# Patient Record
Sex: Female | Born: 1940 | Race: White | Hispanic: No | Smoking: Current some day smoker
Health system: Southern US, Community
[De-identification: ages and names within clinical notes are randomized; demographics above are authoritative.]

## PROBLEM LIST (undated history)

## (undated) DIAGNOSIS — I10 Essential (primary) hypertension: Secondary | ICD-10-CM

## (undated) DIAGNOSIS — E7800 Pure hypercholesterolemia, unspecified: Secondary | ICD-10-CM

## (undated) DIAGNOSIS — F418 Other specified anxiety disorders: Secondary | ICD-10-CM

## (undated) DIAGNOSIS — E119 Type 2 diabetes mellitus without complications: Secondary | ICD-10-CM

## (undated) HISTORY — PX: FRACTURE SURGERY: SHX138

## (undated) HISTORY — PX: ABDOMINAL HYSTERECTOMY: SHX81

---

## 2004-09-15 ENCOUNTER — Emergency Department (HOSPITAL_COMMUNITY): Admission: EM | Admit: 2004-09-15 | Discharge: 2004-09-15 | Payer: Self-pay | Admitting: Emergency Medicine

## 2004-09-17 ENCOUNTER — Inpatient Hospital Stay (HOSPITAL_COMMUNITY): Admission: EM | Admit: 2004-09-17 | Discharge: 2004-09-21 | Payer: Self-pay | Admitting: Emergency Medicine

## 2005-07-29 ENCOUNTER — Ambulatory Visit: Payer: Self-pay | Admitting: Gastroenterology

## 2005-08-04 ENCOUNTER — Ambulatory Visit: Payer: Self-pay | Admitting: Gastroenterology

## 2005-08-04 ENCOUNTER — Ambulatory Visit (HOSPITAL_COMMUNITY): Admission: RE | Admit: 2005-08-04 | Discharge: 2005-08-04 | Payer: Self-pay | Admitting: Gastroenterology

## 2006-07-15 ENCOUNTER — Ambulatory Visit (HOSPITAL_COMMUNITY): Admission: RE | Admit: 2006-07-15 | Discharge: 2006-07-15 | Payer: Self-pay | Admitting: Family Medicine

## 2007-12-13 ENCOUNTER — Other Ambulatory Visit: Admission: RE | Admit: 2007-12-13 | Discharge: 2007-12-13 | Payer: Self-pay | Admitting: Obstetrics and Gynecology

## 2008-05-16 ENCOUNTER — Ambulatory Visit (HOSPITAL_COMMUNITY): Admission: RE | Admit: 2008-05-16 | Discharge: 2008-05-16 | Payer: Self-pay | Admitting: Family Medicine

## 2008-06-19 ENCOUNTER — Ambulatory Visit (HOSPITAL_COMMUNITY): Admission: RE | Admit: 2008-06-19 | Discharge: 2008-06-19 | Payer: Self-pay | Admitting: Family Medicine

## 2010-06-14 NOTE — Discharge Summary (Signed)
NAMEMERION, CATON              ACCOUNT NO.:  [id number]   MEDICAL RECORD NO.:  [id number]          PATIENT TYPE:  INP   LOCATION:  A223                          FACILITY:  [facility]   PHYSICIAN:  Angus G. Renard Matter, MD   DATE OF BIRTH:  1940-04-05   DATE OF ADMISSION:  09/17/2004  DATE OF DISCHARGE:  08/24/2006LH                                 DISCHARGE SUMMARY   DIAGNOSES:  1.  Urosepsis.  2.  Urinary tract infection.  3.  Bronchial pneumonia.  4.  Hypertension.  5.  Hyperlipidemia.  6.  Diabetes mellitus type 2.   CONDITION ON DISCHARGE:  Stable and improved.   HISTORY OF PRESENT ILLNESS:  This is a 70 year old white female who  presented to the ED with mental confusion, decreased mental status.  She was  evaluated by ED physician, and it was felt that she had bronchial pneumonia  and urosepsis.  Chest x-ray showed Cardiomegaly without failure.   PHYSICAL EXAMINATION:  GENERAL APPEARANCE:  Alert female.  VITAL SIGNS:  Blood pressure 110/59, respirations 24, pulse 112.  HEENT:  Eyes PERRLA.  TMs negative.  Oropharynx benign.  NECK:  Supple.  No JVD or thyroid abnormalities.  LUNGS:  Clear to P&A.  HEART:  Regular rhythm.  No murmurs.  ABDOMEN:  No palpable organs or masses.  SKIN:  Warm and dry.  EXTREMITIES:  Free of edema.   LABORATORY DATA:  Admission CBC:  WBC 16,200 with hemoglobin 11.8,  hematocrit 34.2.  Subsequent CBC on September 19, 2004:  WBC 9600, hemoglobin  10.5, hematocrit 31.4.  Chemistries showed sodium 130, potassium 3.5,  chloride 98, CO2 23, glucose 198, BUN 15, creatinine 1.2, calcium 7.8.  Subsequent chemistries on September 19, 2004:  Sodium 138, potassium 3.6,  chloride 106, CO2 25, glucose 158, BUN 8, creatinine 0.7, calcium 8.0.  Urinalysis increased number of WBC's.  Blood culture no growth in five days.  Urine cultures 75,000 colonies per cc.   X-RAYS:  Chest x-ray:  Cardiomegaly, interstitial and peribronchial  opacities.  CT of the head:  No  evidence of intracranial abnormality,  atrophy and chronic small vessel ischemic changes.  Left paranasal  sinusitis.   HOSPITAL COURSE:  The patient, at the time of her admission, was placed on  Rocephin 1 g q.24 h, Levaquin 500 mg IV q.24 h.  She was continued on Zoloft  50 mg daily.  She was placed on 1800 calorie ADA diet, IV fluids, normal  saline at 100 cc per hour.  The patient gradually improved through her  hospital stay.  Her IV Rocephin and  IV Levaquin was discontinued on September 20, 2004.  She was placed on p.o.  Levaquin 500 mg daily.  Was able to be discharged on hospital day #4.  Being  sent home on Levaquin 500 mg daily.  She did not need oxygen at the time of  her discharge.      Angus G. Renard Matter, MD  Electronically Signed     AGM/MEDQ  D:  10/15/2004  T:  10/15/2004  Job:  [id number]

## 2010-06-14 NOTE — Group Therapy Note (Signed)
NAMEKAZI, MONTORO              ACCOUNT NO.:  [id number]   MEDICAL RECORD NO.:  [id number]          PATIENT TYPE:  INP   LOCATION:  A223                          FACILITY:  [facility]   PHYSICIAN:  Angus G. Renard Matter, MD   DATE OF BIRTH:  [birth year]   DATE OF PROCEDURE:  09/18/2004  DATE OF DISCHARGE:                                   PROGRESS NOTE   SUBJECTIVE:  This patient was admitted through the ED with mental confusion  and diminished mental status.  She was felt to have bronchial pneumonia in  early stages and possible sepsis, secondary urinary tract infection.  The  patient still has an elevated white count of 16,200.  X-rays showed  increasing interstitial and peribronchial opacities, _urinary_________  infection.   OBJECTIVE:  VITAL SIGNS:  Blood pressure 147/74, respirations 28, pulse 109,  temperature 99.6.  Blood sugars range from 149-179.  LUNG:  Diminished breath sounds.  HEART:  Regular rate and rhythm.  NEUROLOGICAL:  The patient has generalized weakness.   IMPRESSION:  Possible sepsis.  Possible congestive heart failure.      Angus G. Renard Matter, MD  Electronically Signed     AGM/MEDQ  D:  09/18/2004  T:  09/18/2004  Job:  [id number]

## 2010-06-14 NOTE — Consult Note (Signed)
NAMEBRYNJA, MARKER              ACCOUNT NO.:  [id number]   MEDICAL RECORD NO.:  [id number]          PATIENT TYPE:  AMB   LOCATION:  DAY                           FACILITY:  [facility]   PHYSICIAN:  Kassie Mends, M.D.      DATE OF BIRTH:  11/18/1940   DATE OF CONSULTATION:  07/29/2005  DATE OF DISCHARGE:                                   CONSULTATION   HISTORY OF PRESENT ILLNESS:  Dear Dr. Hart Rochester,   I am seeing [name] as a new patient consultation per your request.  She  is being seen for heme-positive stools.   [name] is a 70 year old female with heme-positive stool in the office in  May 2007.  [name] reports seeing bright red blood in her stool  approximately one year ago.  She denies any melena.  She complains of  difficulty swallowing.  She might vomit once a week.  She denies any nausea.  She has heartburn and indigestion once a day.  She has two to three bowel  movements daily.  Her consistency is mushy to liquid to hard.  She does have  some lower abdominal pain which goes away with bowel movements.  Her history  is somewhat inconsistent.  Initially, she said that she had diarrhea once a  month, then it was watery stool one to two times a month, then it was bowel  movements two to three times a day, varying in consistency. She has not had  a colonoscopy.   PAST MEDICAL HISTORY:  Diabetes, hypertension, and depression. 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   PAST SURGICAL HISTORY:  Hysterectomy, hernia repair, appendectomy.   ALLERGIES:  PENICILLIN.   MEDICATIONS:  She cannot recall her medication list specifically.  She is  on diabetes medicine, hypertension medicine, cholesterol medicine, and a  blood pill.   FAMILY HISTORY:  No family history of colon cancer or colon polyps.  She has  a family history of diabetes.   SOCIAL HISTORY:  She is separated and has no children.  She is an only  child.  She smokes sometimes.   She denies any alcohol use.   REVIEW OF SYSTEMS:  Per HPI.  Otherwise, all systems are negative.   PHYSICAL EXAMINATION:  VITAL SIGNS:  Weight 213.5 pounds.  Height 5 feet and  5-1/2 inches.  BMI 40.2 (morbidly obese).  Temperature 97.6, blood pressure  120/82, pulse 100.  GENERAL:  She is in no apparent distress.  Alert and oriented x 4.  HEENT:  Atraumatic and normocephalic.  Pupils were equal and reactive to light.  Mouth:  No oral lesions.  Posterior pharynx without erythema or exudate.  She has poor dentition.NECK:  Full range of motion and no lymphadenopathy.  LUNGS:  Clear to auscultation bilaterally.  CARDIOVASCULAR:  Regular rhythm.  No murmur.  Normal S1 and S2.  ABDOMEN:  Bowel sounds present, soft,  nontender, nondistended.  No rebound or guarding.  Obese.  No  hepatosplenomegaly appreciated.  EXTREMITIES:  Without clubbing, cyanosis or  edema.  NEUROLOGIC:  She has no focal deficits.   LABORATORY DATA:  August 2006: creatinine 0.7, hemoglobin 10.5 with normal  MCV, platelets 267, albumin 3.4.  Otherwise, her hepatic function panel was  normal.   IMPRESSION:  [name] is a 70 year old female with Hemoccult-positive  stool and a normocytic anemia in August 2006.  She has no overt GI bleeding.  She is average risk for colon cancer occurrence.  Thank you for allowing me  to see [name] in consultation.  My list of recommendations follow.   RECOMMENDATIONS:  I recommend colonoscopy to evaluate her heme-positive  stools.  She may follow up with me as needed.      Kassie Mends, M.D.  Electronically Signed     SM/MEDQ  D:  07/29/2005  T:  07/29/2005  Job:  [id number]   cc:   Zerita Boers, N.M.  Fax: [phone]

## 2010-06-14 NOTE — Group Therapy Note (Signed)
NAMESKILER, TYE              ACCOUNT NO.:  [id number]   MEDICAL RECORD NO.:  [id number]          PATIENT TYPE:  INP   LOCATION:  A223                          FACILITY:  [facility]   PHYSICIAN:  Angus G. Renard Matter, MD   DATE OF BIRTH:  10/03/1940   DATE OF PROCEDURE:  DATE OF DISCHARGE:                                   PROGRESS NOTE   This patient was admitted through the ED with mental confusion, diminished  mental status.  She was felt to have bronchopneumonia in early stages,  possible sepsis secondary to urinary tract infection.  The patient's white  blood count is now 9.6, with hemoglobin 10.5, hematocrit 31.4.   Blood pressure 160/83; respirations 20; pulse 83; temperature 98.3.  HEART:  Regular rhythm.  LUNGS:  Rhonchi over lower lung field.  ABDOMEN:  No palpable organs or masses.   ASSESSMENT:  The patient was admitted with mental confusion, respiratory  distress, possible sepsis.   PLAN:  To continue current regimen.      Angus G. Renard Matter, MD  Electronically Signed     AGM/MEDQ  D:  09/19/2004  T:  09/19/2004  Job:  [id number]

## 2010-06-14 NOTE — Group Therapy Note (Signed)
[name], [name]              ACCOUNT NO.:  [id number]   MEDICAL RECORD NO.:  [id number]          PATIENT TYPE:  INP   LOCATION:  A223                          FACILITY:  [facility]   PHYSICIAN:  Angus G. Renard Matter, MD   DATE OF BIRTH:  11/29/1940   DATE OF PROCEDURE:  09/20/2004  DATE OF DISCHARGE:                                   PROGRESS NOTE   SUBJECTIVE:  This patient was admitted with mental confusion, decreased  mental status.  Was felt to have bronchial pneumonia in early stages,  possible sepsis.  The patient's condition remained stable.   OBJECTIVE:  VITAL SIGNS:  Blood pressure 161/98, respirations 24, pulse  98.2.  LUNGS:  Clear.  HEART:  Regular rhythm.  ABDOMEN:  No palpable organs or masses.   ASSESSMENT:  The patient was admitted with change in mental status.  He has  also upper respiratory infection with cough, possible early pneumonia.  Condition remained stable.   PLAN:  Continue current regimen.  Will attempt to get patient discharged  shortly.      Angus G. Renard Matter, MD  Electronically Signed     AGM/MEDQ  D:  09/20/2004  T:  09/20/2004  Job:  [id number]

## 2010-06-14 NOTE — Op Note (Signed)
NAMEDELLAR, TRABER              ACCOUNT NO.:  [id number]   MEDICAL RECORD NO.:  [id number]          PATIENT TYPE:  AMB   LOCATION:  DAY                           FACILITY:  [facility]   PHYSICIAN:  Kassie Mends, M.D.      DATE OF BIRTH:  13-Feb-1940   DATE OF PROCEDURE:  08/04/2005  DATE OF DISCHARGE:                                 OPERATIVE REPORT   REFERRING PHYSICIAN:  Zerita Boers, N.M.   PROCEDURE:  Colonoscopy.   MEDICATIONS:  1.  Demerol 75 mg IV.  2.  Versed 6 mg IV.   INDICATIONS FOR PROCEDURE:  [name] is a 70 year old patient with heme  positive stool.  She presents as well for average risk colon cancer  screening.   FINDINGS:  1.  Left-sided diverticulosis.  Otherwise she had no evidence of polyps,      masses, inflammatory changes, or vascular ectasias.  2.  Large internal hemorrhoids, likely source of heme positive stool.   RECOMMENDATIONS:  1.  High fiber diet.  Consider stool softener one up to three times daily.  2.  Repeat screening colonoscopy in 10 years.  3.  Follow up with Angus G. McInnis, M.D.   DESCRIPTION OF PROCEDURE:  A physical examination was performed and informed  consent was obtained from the patient after explaining all risks  (perforation, bleeding, infection, adverse effects to medicines), benefits,  and alternatives to the procedure which the patient appeared to understand  and so stated.  The patient was connected to the monitoring device and  placed in the left lateral position.  Continuous oxygen was provided by  nasal cannula and IV medicine was administered through an indwelling  cannula.  After administration of sedation and rectal examination, the  patient's rectum was intubated and the scope was advanced under direct  visualization to the cecum.  The scope was subsequently removed slowly by carefully examining the color,  texture, anatomy, and integrity of mucosa on the way out.  The patient was  recovered in the endoscopy  suite and discharged postoperatively in  satisfactory condition.      Kassie Mends, M.D.  Electronically Signed     SM/MEDQ  D:  08/04/2005  T:  08/04/2005  Job:  [id number]   cc:   Angus G. Renard Matter, MD  Fax: [phone]

## 2010-06-14 NOTE — H&P (Signed)
[name], [name]              ACCOUNT NO.:  [id number]   MEDICAL RECORD NO.:  [id number]          PATIENT TYPE:  INP   LOCATION:  A223                          FACILITY:  [facility]   PHYSICIAN:  Angus G. Renard Matter, MD   DATE OF BIRTH:  [birth year]   DATE OF ADMISSION:  09/16/2004  DATE OF DISCHARGE:  LH                                HISTORY & PHYSICAL   This 70 year old black female presented to the ED with mental confusion,  decreased mental status. She was evaluated by the ED physician and was felt  to have bronchial pneumonia and urosepsis. Chest x-ray showed cardiomegaly  without failure.   LABORATORY DATA:  CBC with a WBC of 15,400, hemoglobin 12.3, hematocrit  35.0. Chemistries - sodium 134, potassium 4, chloride 101, CO2 25.  Urinalysis with a small number of leukocytes, many bacteria. The patient was  subsequent admitted.   SOCIAL HISTORY:  The patient does not smoke or use alcohol.   FAMILY HISTORY:  Positive for coronary artery disease, hypertension.   PAST MEDICAL HISTORY:  1.  Positive for hypertension.  2.  Hyperlipidemia.  3.  Non-insulin-dependent diabetes.   ALLERGIES:  PENICILLIN.   MEDICATIONS:  1.  Zoloft 50 mg daily.  2.  Glucovance 5/500 b.i.d.  3.  Hydrochlorothiazide 25 mg daily.  4.  Zocor 40 mg daily.  5.  Altace 10 mg daily.   REVIEW OF SYSTEMS:  HEENT:  Negative. CARDIOPULMONARY:  Negative for cough.  GI:  No nausea and vomiting, diarrhea. GU:  No dysuria or hematuria.   PHYSICAL EXAMINATION:  GENERAL:  This is an alert female.  VITAL SIGNS:  Blood pressure 110/59, respirations 24, pulse 112, O2  saturation 98%.  HEENT:  Eyes - PERRLA. TMs negative. Oropharynx negative.  NECK:  Supple, no JVD or thyroid abnormalities.  LUNGS:  Clear to P&A.  HEART:  Regular rhythm, no murmurs.  ABDOMEN:  No palpable organs or masses.  SKIN:  Warm and dry.  EXTREMITIES:  Free of edema.  NEUROLOGIC:  No focal deficit.   DIAGNOSES:  1.  Urosepsis.  2.   Probable pneumonia.      Angus G. Renard Matter, MD  Electronically Signed     AGM/MEDQ  D:  09/17/2004  T:  09/17/2004  Job:  [id number]

## 2010-07-31 ENCOUNTER — Inpatient Hospital Stay (HOSPITAL_COMMUNITY)
Admission: EM | Admit: 2010-07-31 | Discharge: 2010-08-02 | DRG: 195 | Disposition: A | Discharge status: Home Health | Payer: Medicare Other | Attending: Family Medicine | Admitting: Family Medicine

## 2010-07-31 ENCOUNTER — Emergency Department (HOSPITAL_COMMUNITY): Payer: Medicare Other

## 2010-07-31 DIAGNOSIS — E1169 Type 2 diabetes mellitus with other specified complication: Secondary | ICD-10-CM | POA: Diagnosis present

## 2010-07-31 DIAGNOSIS — E860 Dehydration: Secondary | ICD-10-CM | POA: Diagnosis present

## 2010-07-31 DIAGNOSIS — Z794 Long term (current) use of insulin: Secondary | ICD-10-CM

## 2010-07-31 DIAGNOSIS — J180 Bronchopneumonia, unspecified organism: Principal | ICD-10-CM | POA: Diagnosis present

## 2010-07-31 LAB — COMPREHENSIVE METABOLIC PANEL
ALT: 12 U/L (ref 0–35)
AST: 16 U/L (ref 0–37)
Albumin: 3.5 g/dL (ref 3.5–5.2)
CO2: 28 mEq/L (ref 19–32)
Calcium: 9.6 mg/dL (ref 8.4–10.5)
GFR calc Af Amer: 57 mL/min — ABNORMAL LOW (ref >60)
Glucose, Bld: 82 mg/dL (ref 70–99)
Potassium: 4.1 mEq/L (ref 3.5–5.1)
Sodium: 140 mEq/L (ref 135–145)
Total Bilirubin: 0.2 mg/dL — ABNORMAL LOW (ref 0.3–1.2)

## 2010-07-31 LAB — DIFFERENTIAL
Basophils Absolute: 0 10*3/uL (ref 0.0–0.1)
Eosinophils Absolute: 0.2 10*3/uL (ref 0.0–0.7)
Lymphocytes Relative: 17 % (ref 12–46)
Monocytes Absolute: 0.6 10*3/uL (ref 0.1–1.0)
Monocytes Relative: 6 % (ref 3–12)
Neutrophils Relative %: 75 % (ref 43–77)

## 2010-07-31 LAB — GLUCOSE, CAPILLARY
Glucose-Capillary: 168 mg/dL — ABNORMAL HIGH (ref 70–99)
Glucose-Capillary: 55 mg/dL — ABNORMAL LOW (ref 70–99)

## 2010-07-31 LAB — URINALYSIS, ROUTINE W REFLEX MICROSCOPIC
Glucose, UA: 500 mg/dL — AB
Leukocytes, UA: NEGATIVE
Nitrite: NEGATIVE
Protein, ur: NEGATIVE mg/dL
Urobilinogen, UA: 0.2 mg/dL (ref 0.0–1.0)

## 2010-07-31 LAB — CK TOTAL AND CKMB (NOT AT ARMC)
Relative Index: INVALID (ref 0.0–2.5)
Total CK: 59 U/L (ref 7–177)

## 2010-08-01 LAB — DIFFERENTIAL
Basophils Relative: 0 % (ref 0–1)
Eosinophils Relative: 2 % (ref 0–5)
Lymphs Abs: 2.5 10*3/uL (ref 0.7–4.0)
Monocytes Relative: 7 % (ref 3–12)
Neutro Abs: 4.8 10*3/uL (ref 1.7–7.7)
Neutrophils Relative %: 60 % (ref 43–77)

## 2010-08-01 LAB — GLUCOSE, CAPILLARY
Glucose-Capillary: 207 mg/dL — ABNORMAL HIGH (ref 70–99)
Glucose-Capillary: 211 mg/dL — ABNORMAL HIGH (ref 70–99)
Glucose-Capillary: 220 mg/dL — ABNORMAL HIGH (ref 70–99)
Glucose-Capillary: 259 mg/dL — ABNORMAL HIGH (ref 70–99)

## 2010-08-01 LAB — BASIC METABOLIC PANEL
CO2: 26 mEq/L (ref 19–32)
Potassium: 4.2 mEq/L (ref 3.5–5.1)
Sodium: 138 mEq/L (ref 135–145)

## 2010-08-01 LAB — CBC
HCT: 34.3 % — ABNORMAL LOW (ref 36.0–46.0)
MCH: 30.1 pg (ref 26.0–34.0)
MCV: 89.1 fL (ref 78.0–100.0)

## 2010-08-02 ENCOUNTER — Inpatient Hospital Stay (HOSPITAL_COMMUNITY): Payer: Medicare Other

## 2010-08-02 LAB — URINE CULTURE
Colony Count: NO GROWTH
Culture: NO GROWTH

## 2010-08-02 LAB — GLUCOSE, CAPILLARY
Glucose-Capillary: 195 mg/dL — ABNORMAL HIGH (ref 70–99)
Glucose-Capillary: 232 mg/dL — ABNORMAL HIGH (ref 70–99)

## 2010-08-02 MED ORDER — ENOXAPARIN SODIUM 40 MG/0.4ML ~~LOC~~ SOLN: 40.0000 mg | SUBCUTANEOUS | Status: DC

## 2010-08-02 MED ORDER — BIOTENE DRY MOUTH MT LIQD
Freq: Two times a day (BID) | OROMUCOSAL | Status: DC
  Filled 2010-08-02 (×5): qty 15

## 2010-08-02 MED ORDER — DEXTROSE 5 % IV SOLN: 1.0000 g | INTRAVENOUS | Status: DC

## 2010-08-02 MED ORDER — LINAGLIPTIN 5 MG PO TABS
5.0000 mg | ORAL_TABLET | Freq: Every day | ORAL | Status: DC
  Filled 2010-08-02 (×3): qty 1

## 2010-08-02 MED ORDER — INSULIN ASPART 100 UNIT/ML ~~LOC~~ SOLN: 0.0000 [IU] | Freq: Three times a day (TID) | SUBCUTANEOUS | Status: DC

## 2010-08-02 MED ORDER — DEXTROSE-NACL 5-0.9 % IV SOLN: INTRAVENOUS | Status: DC

## 2010-08-02 MED ORDER — INSULIN ASPART 100 UNIT/ML ~~LOC~~ SOLN: 0.0000 [IU] | Freq: Every day | SUBCUTANEOUS | Status: DC

## 2010-08-02 MED ORDER — AZITHROMYCIN 250 MG PO TABS: 500.0000 mg | ORAL_TABLET | Freq: Every day | ORAL | Status: DC

## 2010-08-03 NOTE — Discharge Summary (Signed)
[name], [name]              ACCOUNT NO.:  [id number]  MEDICAL RECORD NO.:  [id number]  LOCATION:  A316                          FACILITY:  [facility]  PHYSICIAN:  Jerzy Roepke G. Renard Matter, MD   DATE OF BIRTH:  09/02/1940  DATE OF ADMISSION:  07/31/2010 DATE OF DISCHARGE:  07/06/2012LH                              DISCHARGE SUMMARY   DIAGNOSES: 1. Non-insulin dependent diabetes. 2. Hypoglycemia. 3. Bronchopneumonia.  The patient's condition stable at that the time of her discharge.  This 70 year old white female became unresponsive at home.  EMS was called, was found that she had extremely low blood sugar of 20.  She was given an amp of D50, repeat blood sugar was 126, and she did not do another amp of D50 and sugar started dropping, and she was brought to emergency room, and seen by emergency room physician.  She was started on IV glucose.  X-rays of her chest showed patchy perihilar upper lobe infiltrate thought to be atelectasis or infiltrates.  The patient was placed on IV Rocephin 1 g daily and Zithromax 500 mg every 24 hours, and subsequently was admitted.  PHYSICAL EXAMINATION:  GENERAL:  On admission, alert and oriented. VITAL SIGNS:  The patient with blood pressure 149/84, respirations 20, pulse 97.4. HEENT:  Eyes PERRLA.  TMs negative.  Oropharynx benign. NECK:  Supple.  No JVD, no thyroid abnormalities. LUNGS:  Diminished breath sounds. HEART:  Regular rhythm.  No murmurs. ABDOMEN:  No palpable organs or masses.  No organomegaly. EXTREMITIES:  Free of edema. NEUROLOGIC:  No focal deficits.  LABORATORY DATA:  Admission CBC, WBC 9300 with hemoglobin 12.4, hematocrit 37.0.  CMP, sodium 140, potassium 4.1, chloride 102, CO2 of 28, glucose 82, creatinine 1.15, CPK-MB 2.6, CPK 59, lipase 106. Hemoglobin A1c was 8.6.  BMET on August 01, 2010, showed sodium 138, potassium 4.2, chloride 101, CO2 of 26, glucose 214, BUN 19, creatinine 1.04.  X-rays, chest x-ray done on July 31, 2010,  showed evidence of cardiomegaly, low lung volumes with patchy perihilar and upper lobe infiltrate.  Subsequent chest x-ray on August 02, 2010, improve in aeration bilaterally, suspect resolving asymmetric edema and/or atelectasis.  HOSPITAL COURSE:  The patient at the time of her admission was placed on IV D5 normal saline at 100 mL an hour, 1800-calorie ADA diet, nasal O2 at 2 liters per minute.  She was placed on NovoLog sliding scale insulin and subcutaneous Lovenox daily for DVT prophylaxis.  Also was placed on IV Rocephin 1 g every 24 hours and Zithromax 500 mg daily.  The patient's blood sugars responded to IV glucose.  Subsequently, her glipizide was discontinued and she was placed on Tradjenta 5 mg daily and continued on the sliding scale.  The patient became more alert.  Her sugars ranged 195-240.  It was felt that she was stable enough after 2 days' hospitalization, be discharged home.  Arrangements were made through Social Services for her to have help at home.  She was discharged on following medications: 1. Tradjenta 5 mg daily. 2. HCTZ 25 mg daily. 3. Lorazepam 0.5 mg every 4 hours as needed. 4. Nortriptyline 25 mg b.i.d. 5. Simvastatin 40 mg daily. 6. Vicodin  ES 7.5/750 every 4 hours as needed. 7. Vitamin D3 1000 units daily. 8. Metformin 500 mg b.i.d. 9. Glyburide 5 mg b.i.d.     [name] G. Renard Matter, MD     AGM/MEDQ  D:  08/02/2010  T:  08/03/2010  Job:  [id number]

## 2010-08-20 NOTE — Progress Notes (Unsigned)
[name], [name]              ACCOUNT NO.:  [id number]  MEDICAL RECORD NO.:  [id number]  LOCATION:                                 FACILITY:  PHYSICIAN:  Zakery Normington G. Renard Matter, MD   DATE OF BIRTH:  1940/02/14  DATE OF PROCEDURE: DATE OF DISCHARGE:                                PROGRESS NOTE   This patient was admitted to the hospital after having experienced episode of hypoglycemia.  She also noted to have perihilar infiltrate and pneumonia, was started on IV Rocephin and Zithromax.  Her blood sugars currently running from 211-259.  OBJECTIVE:  VITAL SIGNS:  Blood pressure 184/117, pulse 106, respirations 16, temperature 97.6.  Her hemoglobin A1c was 8.6. LUNGS:  Diminished breath sounds bilaterally. HEART:  Regular rhythm. ABDOMEN:  No palpable organs or masses.  ASSESSMENT:  The patient was admitted with hypoglycemia.  He does have diabetes and remained on oral medications.  Does have perihilar infiltrate on x-ray.  The patient is feeling some better.  Sugar is more stable.  Apparently per the nursing staff, the patient does have issues at home indicative of the fact that she is not taking her medications appropriately.  PLAN:  To obtain social service consult concerning these problems. Repeat chest x-ray.     Montel Vanderhoof G. Renard Matter, MD     AGM/MEDQ  D:  08/02/2010  T:  08/02/2010  Job:  [id number]

## 2010-08-23 NOTE — Progress Notes (Signed)
  [name], [name]              ACCOUNT NO.:  [id number]  MEDICAL RECORD NO.:  [id number]  LOCATION:  A316                          FACILITY:  [facility]  PHYSICIAN:  Mayzee Reichenbach G. Renard Matter, MD   DATE OF BIRTH:  1941-01-19  DATE OF PROCEDURE: DATE OF DISCHARGE:                                PROGRESS NOTE   This patient was admitted to the hospital after having experienced episode of hypoglycemia.  She was given D50 on 2 occasions.  Her blood sugars stabilized.  She was also noted to have evidence of pneumonia, perihilar infiltrate and cardiomegaly by x-ray, was started on IV Rocephin and Zithromax.  OBJECTIVE:  VITAL SIGNS:  Blood pressure 149/84, respirations 20, pulse 97, temperature 97.4. LUNGS:  Diminished breath sounds bilaterally. HEART:  Regular rhythm. ABDOMEN:  No palpable organs or masses.  ASSESSMENT:  The patient admitted with hypoglycemia.  She does have non- insulin-dependent diabetes.  She is noted to have evidence of perihilar infiltrate compatible with pneumonia.  PLAN:  To continue to monitor blood sugars.  Continue IV antibiotic.     Devonia Farro G. Renard Matter, MD     AGM/MEDQ  D:  08/01/2010  T:  08/01/2010  Job:  [id number]  Electronically Signed by Butch Penny MD on 08/23/2010 01:55:13 PM

## 2010-08-26 NOTE — H&P (Signed)
  NAMEEILEY, MCGINNITY              ACCOUNT NO.:  [id number]  MEDICAL RECORD NO.:  [id number]  LOCATION:  A316                          FACILITY:  [facility]  PHYSICIAN:  Marianela Mandrell G. Renard Matter, MD   DATE OF BIRTH:  [birth year]  DATE OF ADMISSION:  07/31/2010 DATE OF DISCHARGE:  LH                             HISTORY & PHYSICAL   This 70 year old white female became unresponsive at home.  EMS was called and found that she had an extremely low blood sugar of 20.  She was given an amp of D50, repeat blood sugar was 126 and then she did need another amp of D50 such that blood sugar drop back again to 55. She was brought into the emergency room and seen by emergency room physician.  X-rays of chest showed patchy perihilar upper lobe opacities, thought to be atelectasis or infiltrates.  The patient was subsequently admitted.  Placed on IV Rocephin 1 gram and Zithromax 500 mg every 24 hours.  SURGICAL HISTORY:  Unknown.  SOCIAL HISTORY:  The patient lives alone.  The patient has past history of diabetes.  MEDICATION LIST: 1. Metformin 500 mg. 2. Glyburide 5 mg daily. 3. Nortriptyline 25 mg. 4. Lorazepam 0.5 mg. 5. Hydrochlorothiazide 25 mg. 6. Simvastatin 40 mg.  PHYSICAL EXAMINATION:  VITAL SIGNS:  The patient is alert and oriented with blood pressure 149/84, respirations 20, pulse 97.4. HEENT:  Eyes:  PERRLA.  TMs negative.  Oropharynx benign. NECK:  Supple.  No JVD or thyroid abnormalities. LUNGS:  Diminished breath sounds. HEART:  Regular rhythm.  No murmurs. Abdomen:  No palpable organs or masses.  No organomegaly. EXTREMITIES:  Free of edema. NEUROLOGIC:  No focal deficit.  ASSESSMENT:  The patient does have known diabetes, was admitted with hypoglycemia and found to have evidence of pneumonia, patchy perihilar and upper lobe atelectasis or infiltrates.  PLAN:  To continue IV antibiotics, continue to monitor blood sugars. The patient currently is on sliding scale NovoLog  insulin.     Kaulin Chaves G. Renard Matter, MD     AGM/MEDQ  D:  08/01/2010  T:  08/01/2010  Job:  [id number]  Electronically Signed by Butch Penny MD on 08/26/2010 06:52:30 AM

## 2012-06-21 ENCOUNTER — Encounter (HOSPITAL_COMMUNITY): Payer: Self-pay | Admitting: *Deleted

## 2012-06-21 ENCOUNTER — Emergency Department (HOSPITAL_COMMUNITY): Payer: Medicare Other

## 2012-06-21 ENCOUNTER — Emergency Department (HOSPITAL_COMMUNITY)
Admission: EM | Admit: 2012-06-21 | Discharge: 2012-06-21 | Disposition: A | Discharge status: Home / Self Care | Payer: Medicare Other | Attending: Emergency Medicine | Admitting: Emergency Medicine

## 2012-06-21 DIAGNOSIS — E7800 Pure hypercholesterolemia, unspecified: Secondary | ICD-10-CM | POA: Insufficient documentation

## 2012-06-21 DIAGNOSIS — Z79899 Other long term (current) drug therapy: Secondary | ICD-10-CM | POA: Insufficient documentation

## 2012-06-21 DIAGNOSIS — IMO0002 Reserved for concepts with insufficient information to code with codable children: Secondary | ICD-10-CM | POA: Insufficient documentation

## 2012-06-21 DIAGNOSIS — Y9389 Activity, other specified: Secondary | ICD-10-CM | POA: Insufficient documentation

## 2012-06-21 DIAGNOSIS — F17200 Nicotine dependence, unspecified, uncomplicated: Secondary | ICD-10-CM | POA: Insufficient documentation

## 2012-06-21 DIAGNOSIS — S20219A Contusion of unspecified front wall of thorax, initial encounter: Secondary | ICD-10-CM | POA: Insufficient documentation

## 2012-06-21 DIAGNOSIS — N390 Urinary tract infection, site not specified: Secondary | ICD-10-CM | POA: Insufficient documentation

## 2012-06-21 DIAGNOSIS — R509 Fever, unspecified: Secondary | ICD-10-CM | POA: Insufficient documentation

## 2012-06-21 DIAGNOSIS — Z880 Allergy status to penicillin: Secondary | ICD-10-CM | POA: Insufficient documentation

## 2012-06-21 DIAGNOSIS — W010XXA Fall on same level from slipping, tripping and stumbling without subsequent striking against object, initial encounter: Secondary | ICD-10-CM | POA: Insufficient documentation

## 2012-06-21 DIAGNOSIS — Z8781 Personal history of (healed) traumatic fracture: Secondary | ICD-10-CM | POA: Insufficient documentation

## 2012-06-21 DIAGNOSIS — S20212A Contusion of left front wall of thorax, initial encounter: Secondary | ICD-10-CM

## 2012-06-21 DIAGNOSIS — E119 Type 2 diabetes mellitus without complications: Secondary | ICD-10-CM | POA: Insufficient documentation

## 2012-06-21 DIAGNOSIS — S3981XA Other specified injuries of abdomen, initial encounter: Secondary | ICD-10-CM | POA: Insufficient documentation

## 2012-06-21 DIAGNOSIS — Z90710 Acquired absence of both cervix and uterus: Secondary | ICD-10-CM | POA: Insufficient documentation

## 2012-06-21 DIAGNOSIS — Y9289 Other specified places as the place of occurrence of the external cause: Secondary | ICD-10-CM | POA: Insufficient documentation

## 2012-06-21 DIAGNOSIS — R0602 Shortness of breath: Secondary | ICD-10-CM | POA: Insufficient documentation

## 2012-06-21 DIAGNOSIS — I10 Essential (primary) hypertension: Secondary | ICD-10-CM | POA: Insufficient documentation

## 2012-06-21 HISTORY — DX: Pure hypercholesterolemia, unspecified: E78.00

## 2012-06-21 HISTORY — DX: Type 2 diabetes mellitus without complications: E11.9

## 2012-06-21 HISTORY — DX: Essential (primary) hypertension: I10

## 2012-06-21 LAB — POCT I-STAT, CHEM 8
Calcium, Ion: 1.18 mmol/L (ref 1.13–1.30)
Creatinine, Ser: 1 mg/dL (ref 0.50–1.10)
Glucose, Bld: 109 mg/dL — ABNORMAL HIGH (ref 70–99)
Hemoglobin: 12.9 g/dL (ref 12.0–15.0)
Potassium: 4.2 mEq/L (ref 3.5–5.1)

## 2012-06-21 LAB — URINALYSIS, ROUTINE W REFLEX MICROSCOPIC
Bilirubin Urine: NEGATIVE
Hgb urine dipstick: NEGATIVE
Ketones, ur: NEGATIVE mg/dL
Nitrite: POSITIVE — AB
Specific Gravity, Urine: 1.02 (ref 1.005–1.030)
Urobilinogen, UA: 0.2 mg/dL (ref 0.0–1.0)

## 2012-06-21 LAB — COMPREHENSIVE METABOLIC PANEL
ALT: 11 U/L (ref 0–35)
AST: 19 U/L (ref 0–37)
Alkaline Phosphatase: 71 U/L (ref 39–117)
CO2: 31 mEq/L (ref 19–32)
Chloride: 99 mEq/L (ref 96–112)
GFR calc Af Amer: 61 mL/min — ABNORMAL LOW (ref >90)
GFR calc non Af Amer: 52 mL/min — ABNORMAL LOW (ref >90)
Glucose, Bld: 208 mg/dL — ABNORMAL HIGH (ref 70–99)
Potassium: 4.6 mEq/L (ref 3.5–5.1)
Sodium: 138 mEq/L (ref 135–145)
Total Bilirubin: 0.2 mg/dL — ABNORMAL LOW (ref 0.3–1.2)

## 2012-06-21 LAB — CBC WITH DIFFERENTIAL/PLATELET
Basophils Absolute: 0 10*3/uL (ref 0.0–0.1)
Eosinophils Relative: 2 % (ref 0–5)
Lymphocytes Relative: 30 % (ref 12–46)
Lymphs Abs: 2.6 10*3/uL (ref 0.7–4.0)
MCV: 88.7 fL (ref 78.0–100.0)
Neutro Abs: 5.4 10*3/uL (ref 1.7–7.7)
Platelets: 260 10*3/uL (ref 150–400)
RBC: 4.26 MIL/uL (ref 3.87–5.11)
RDW: 12.7 % (ref 11.5–15.5)
WBC: 8.7 10*3/uL (ref 4.0–10.5)

## 2012-06-21 LAB — URINE MICROSCOPIC-ADD ON

## 2012-06-21 LAB — GLUCOSE, CAPILLARY

## 2012-06-21 MED ORDER — CIPROFLOXACIN HCL 250 MG PO TABS
500.0000 mg | ORAL_TABLET | Freq: Once | ORAL | Status: AC
  Administered 2012-06-21: 500 mg via ORAL
  Filled 2012-06-21: qty 2

## 2012-06-21 MED ORDER — CIPROFLOXACIN HCL 500 MG PO TABS: 500.0000 mg | ORAL_TABLET | Freq: Two times a day (BID) | ORAL | Status: DC

## 2012-06-21 MED ORDER — IOHEXOL 300 MG/ML  SOLN
100.0000 mL | Freq: Once | INTRAMUSCULAR | Status: AC | PRN
  Administered 2012-06-21: 100 mL via INTRAVENOUS
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
## 2012-06-21 NOTE — ED Notes (Signed)
Red socks and yellow bracelet placed.  Family members remain at bedside.
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
## 2012-06-21 NOTE — ED Provider Notes (Signed)
History    This chart was scribed for Benny Lennert, MD by Leone Payor, ED Scribe. This patient was seen in room APA10/APA10 and the patient's care was started 11:24 AM.   CSN: [id number]  Arrival date & time 06/21/12  0940   First MD Initiated Contact with Patient 06/21/12 1122      Chief Complaint  Patient presents with  . Back Pain  . Fall     Patient is a 72 y.o. female presenting with fall. The history is provided by the patient. No language interpreter was used.  Fall This is a new problem. The current episode started 2 days ago. The problem occurs constantly. The problem has not changed since onset.Associated symptoms include chest pain, abdominal pain and shortness of breath. Pertinent negatives include no headaches.    HPI Comments: [name] is a 72 y.o. female who presents to the Emergency Department complaining of a fall that occurred 2 days ago. Pt states she slipped and fell on coccyx while getting out of a car. Now she complains of pain to L lateral chest and abdomen. She has an associated fever. She denies hitting her head or having LOC. She denies cough, vomiting, back pain. Pt has h/o DM, HTN, hypercholesterolemia.     Past Medical History  Diagnosis Date  . Diabetes mellitus without complication   . Hypertension   . Hypercholesterolemia     Past Surgical History  Procedure Laterality Date  . Abdominal hysterectomy    . Fracture surgery      History reviewed. No pertinent family history.  History  Substance Use Topics  . Smoking status: Current Some Day Smoker    Types: Cigarettes  . Smokeless tobacco: Not on file  . Alcohol Use: No    OB History   Grav Para Term Preterm Abortions TAB SAB Ect Mult Living   0               Review of Systems  Constitutional: Positive for fever. Negative for appetite change and fatigue.  HENT: Negative for congestion, neck pain, sinus pressure and ear discharge.   Eyes: Negative for discharge.   Respiratory: Positive for shortness of breath. Negative for cough.   Cardiovascular: Positive for chest pain.  Gastrointestinal: Positive for abdominal pain. Negative for diarrhea.  Genitourinary: Negative for frequency and hematuria.  Musculoskeletal: Positive for arthralgias (rib pain). Negative for back pain.  Skin: Negative for rash.  Neurological: Negative for seizures and headaches.  Psychiatric/Behavioral: Negative for hallucinations.    Allergies  Penicillins  Home Medications   Current Outpatient Rx  Name  Route  Sig  Dispense  Refill  . calcium carbonate (OS-CAL) 600 MG TABS   Oral   Take 600 mg by mouth 2 (two) times daily with a meal.         . glipiZIDE-metformin (METAGLIP) 5-500 MG per tablet   Oral   Take 1 tablet by mouth 2 (two) times daily before a meal.         . hydrochlorothiazide (HYDRODIURIL) 25 MG tablet   Oral   Take 25 mg by mouth daily as needed (blood pressure).         Marland Kitchen HYDROcodone-acetaminophen (VICODIN) 5-500 MG per tablet   Oral   Take 1 tablet by mouth every 4 (four) hours as needed for pain.         Marland Kitchen linagliptin (TRADJENTA) 5 MG TABS tablet   Oral   Take 5 mg by mouth daily.         Marland Kitchen  LORazepam (ATIVAN) 0.5 MG tablet   Oral   Take 0.5 mg by mouth every 4 (four) hours as needed for anxiety.         . nortriptyline (PAMELOR) 25 MG capsule   Oral   Take 25 mg by mouth 2 (two) times daily.         . ondansetron (ZOFRAN) 4 MG tablet   Oral   Take 4 mg by mouth every 6 (six) hours as needed for nausea.         . simvastatin (ZOCOR) 40 MG tablet   Oral   Take 40 mg by mouth every evening.           BP 156/74  Pulse 111  Temp(Src) 98.3 F (36.8 C) (Oral)  Resp 19  Ht 5\' 4"  (1.626 m)  Wt 191 lb (86.637 kg)  BMI 32.77 kg/m2  SpO2 97%  Physical Exam  Constitutional: She is oriented to person, place, and time. She appears well-developed.  HENT:  Head: Normocephalic.  Eyes: Conjunctivae and EOM are normal.  No scleral icterus.  Neck: Neck supple. No thyromegaly present.  Cardiovascular: Normal rate and regular rhythm.  Exam reveals no gallop and no friction rub.   No murmur heard. Pulmonary/Chest: Effort normal and breath sounds normal. No stridor. She has no wheezes. She has no rales. She exhibits no tenderness.  Abdominal: She exhibits no distension. There is no tenderness. There is no rebound.  Moderate tenderness to LUQ  Musculoskeletal: Normal range of motion. She exhibits no edema.  Moderate tenderness to left lateral ribs.   Lymphadenopathy:    She has no cervical adenopathy.  Neurological: She is oriented to person, place, and time. Coordination normal.  Skin: No rash noted. No erythema.  Psychiatric: She has a normal mood and affect. Her behavior is normal.    ED Course  Procedures (including critical care time)  DIAGNOSTIC STUDIES: Oxygen Saturation is 97% on room air, adequate by my interpretation.    COORDINATION OF CARE: 11:34 AM Discussed treatment plan with pt at bedside and pt agreed to plan.   Labs Reviewed  GLUCOSE, CAPILLARY - Abnormal; Notable for the following:    Glucose-Capillary 305 (*)    All other components within normal limits  COMPREHENSIVE METABOLIC PANEL - Abnormal; Notable for the following:    Glucose, Bld 208 (*)    Albumin 3.3 (*)    Total Bilirubin 0.2 (*)    GFR calc non Af Amer 52 (*)    GFR calc Af Amer 61 (*)    All other components within normal limits  URINALYSIS, ROUTINE W REFLEX MICROSCOPIC - Abnormal; Notable for the following:    APPearance CLOUDY (*)    Glucose, UA 500 (*)    Nitrite POSITIVE (*)    All other components within normal limits  URINE MICROSCOPIC-ADD ON - Abnormal; Notable for the following:    Bacteria, UA MANY (*)    All other components within normal limits  POCT I-STAT, CHEM 8 - Abnormal; Notable for the following:    Glucose, Bld 109 (*)    All other components within normal limits  URINE CULTURE  CBC WITH  DIFFERENTIAL   Dg Chest 2 View  06/21/2012   *RADIOLOGY REPORT*  Clinical Data: Chest pain.  CHEST - 2 VIEW  Comparison: 08/02/2010.  Findings: The heart is enlarged but stable.  There is tortuosity of the thoracic aorta.  Low lung volumes with vascular crowding and basilar atelectasis.  Stable eventration of  the right hemidiaphragm.  The bony thorax is grossly intact.  IMPRESSION: No acute cardiopulmonary findings for   Original Report Authenticated By: Rudie Meyer, M.D.   Ct Abdomen Pelvis W Contrast  06/21/2012   *RADIOLOGY REPORT*  Clinical Data: Larey Seat.  Abdominal pain.  CT ABDOMEN AND PELVIS WITH CONTRAST  Technique:  Multidetector CT imaging of the abdomen and pelvis was performed following the standard protocol during bolus administration of intravenous contrast.  Contrast: OMNIPAQUE IOHEXOL 300 MG/ML  SOLN  Comparison: None.  Findings: The lung bases are clear.  No pleural effusion or pneumothorax.  No definite lower left rib fractures.  A hiatal hernia is noted.  The solid abdominal organs are intact.  No acute injury.  The gallbladder is surgically absent.  No common bile duct dilatation. There are calcifications involving the left adrenal gland which could be related to previous infection and/or hemorrhage.  The stomach, duodenum, small bowel and colon are grossly normal without oral contrast.  There is a large amount of stool throughout the colon suggesting constipation.  The aorta is normal in caliber.  The major branch vessels are patent.  Scattered atherosclerotic calcifications.  No mesenteric or retroperitoneal mass, adenopathy or hematoma.  The uterus is surgically absent.  The bladder appears normal.  No pelvic mass, adenopathy or free pelvic fluid collections.  No inguinal mass or hernia.  The bony pelvis is intact.  Remote pubic rami fractures are noted. Both hips are normally located.  The pubic symphysis and SI joints are intact.  No sacral fracture.  The lumbar vertebral bodies  are normally aligned.  There are severe degenerative changes at L1-2 and L2-3.  Severe facet disease in the lower lumbar spine.  IMPRESSION:  1.  No acute abdominal/pelvic findings. 2.  No acute bony findings. 3.  Anterior abdominal wall hernia containing omental fat and vessels but no bowel.   Original Report Authenticated By: Rudie Meyer, M.D.     No diagnosis found.    MDM      The chart was scribed for me under my direct supervision.  I personally performed the history, physical, and medical decision making and all procedures in the evaluation of this patient.Benny Lennert, MD 06/21/12 [phone]

## 2012-06-21 NOTE — ED Notes (Signed)
Pt complains of pain at the left lower border of the left ribs/distal flank area, states that area is tender to palpation.

## 2012-06-21 NOTE — ED Notes (Signed)
Caregiver reports foul smelling urine and wants the patient's urine checked for infection.

## 2012-06-23 LAB — URINE CULTURE: Colony Count: >=100000

## 2012-06-24 ENCOUNTER — Telehealth (HOSPITAL_COMMUNITY): Payer: Self-pay | Admitting: Emergency Medicine

## 2012-06-24 NOTE — Progress Notes (Signed)
ED Antimicrobial Stewardship Positive Culture Follow Up   [name] is an 71 y.o. female who presented to [facility] on 06/21/2012 with a chief complaint of  Chief Complaint  Patient presents with  . Back Pain  . Fall    Recent Results (from the past 720 hour(s))  URINE CULTURE     Status: None   Collection Time    06/21/12  1:00 PM      Result Value Range Status   Specimen Description URINE, CLEAN CATCH   Final   Special Requests NONE   Final   Culture  Setup Time 06/21/2012 14:10   Final   Colony Count >=100,000 COLONIES/ML   Final   Culture ESCHERICHIA COLI   Final   Report Status 06/23/2012 FINAL   Final   Organism ID, Bacteria ESCHERICHIA COLI   Final    [x]  Treated with Cipro, organism resistant to prescribed antimicrobial []  Patient discharged originally without antimicrobial agent and treatment is now indicated  New antibiotic prescription: Keflex 500mg  TID x 7 days  ED Provider: Theda Sers [facility] 06/24/2012, 10:26 AM Infectious Diseases Pharmacist Phone# [phone]

## 2012-06-25 NOTE — ED Notes (Signed)
Post ED Visit - Positive Culture Follow-up: Successful Patient Follow-Up  Culture assessed and recommendations reviewed by: []  Wes Dulaney, Pharm.D., BCPS []  Celedonio Miyamoto, Pharm.D., BCPS []  Georgina Pillion, Pharm.D., BCPS [x]  [place], [place].D., BCPS, AAHIVP []  Estella Husk, Pharm.D., BCPS, AAHIVP  Positive urine culture  [x]  Patient discharged without antimicrobial prescription and treatment is now indicated []  Organism is resistant to prescribed ED discharge antimicrobial []  Patient with positive blood cultures  Changes discussed with ED provider:Brian Hyacinth Meeker New antibiotic prescription Keflex 50 mg po TID x 7 days Called to [facility] patient: Patient notified of positive results-06/24/2012   Larena Sox 06/25/2012, 11:32 AM

## 2013-09-27 ENCOUNTER — Emergency Department (HOSPITAL_COMMUNITY): Payer: Medicare Other

## 2013-09-27 ENCOUNTER — Encounter (HOSPITAL_COMMUNITY): Payer: Self-pay | Admitting: Emergency Medicine

## 2013-09-27 ENCOUNTER — Inpatient Hospital Stay (HOSPITAL_COMMUNITY): Payer: Medicare Other

## 2013-09-27 ENCOUNTER — Inpatient Hospital Stay (HOSPITAL_COMMUNITY)
Admission: EM | Admit: 2013-09-27 | Discharge: 2013-10-01 | DRG: 689 | Disposition: A | Discharge status: Home Health | Payer: Medicare Other | Attending: Family Medicine | Admitting: Family Medicine

## 2013-09-27 DIAGNOSIS — R4182 Altered mental status, unspecified: Secondary | ICD-10-CM | POA: Diagnosis not present

## 2013-09-27 DIAGNOSIS — N390 Urinary tract infection, site not specified: Secondary | ICD-10-CM | POA: Diagnosis present

## 2013-09-27 DIAGNOSIS — E7800 Pure hypercholesterolemia, unspecified: Secondary | ICD-10-CM | POA: Diagnosis present

## 2013-09-27 DIAGNOSIS — A498 Other bacterial infections of unspecified site: Secondary | ICD-10-CM | POA: Diagnosis present

## 2013-09-27 DIAGNOSIS — G9340 Encephalopathy, unspecified: Secondary | ICD-10-CM | POA: Diagnosis present

## 2013-09-27 DIAGNOSIS — E669 Obesity, unspecified: Secondary | ICD-10-CM | POA: Diagnosis present

## 2013-09-27 DIAGNOSIS — Z23 Encounter for immunization: Secondary | ICD-10-CM

## 2013-09-27 DIAGNOSIS — R509 Fever, unspecified: Secondary | ICD-10-CM

## 2013-09-27 DIAGNOSIS — I10 Essential (primary) hypertension: Secondary | ICD-10-CM | POA: Diagnosis present

## 2013-09-27 DIAGNOSIS — M1990 Unspecified osteoarthritis, unspecified site: Secondary | ICD-10-CM | POA: Diagnosis present

## 2013-09-27 DIAGNOSIS — E119 Type 2 diabetes mellitus without complications: Secondary | ICD-10-CM | POA: Diagnosis present

## 2013-09-27 DIAGNOSIS — Z6831 Body mass index (BMI) 31.0-31.9, adult: Secondary | ICD-10-CM | POA: Diagnosis not present

## 2013-09-27 DIAGNOSIS — F17200 Nicotine dependence, unspecified, uncomplicated: Secondary | ICD-10-CM | POA: Diagnosis present

## 2013-09-27 DIAGNOSIS — Z8673 Personal history of transient ischemic attack (TIA), and cerebral infarction without residual deficits: Secondary | ICD-10-CM | POA: Diagnosis not present

## 2013-09-27 DIAGNOSIS — R4702 Dysphasia: Secondary | ICD-10-CM | POA: Diagnosis present

## 2013-09-27 DIAGNOSIS — E785 Hyperlipidemia, unspecified: Secondary | ICD-10-CM | POA: Diagnosis present

## 2013-09-27 LAB — COMPREHENSIVE METABOLIC PANEL
ALK PHOS: 66 U/L (ref 39–117)
ALT: 13 U/L (ref 0–35)
AST: 22 U/L (ref 0–37)
Albumin: 3.6 g/dL (ref 3.5–5.2)
Anion gap: 12 (ref 5–15)
BILIRUBIN TOTAL: 0.3 mg/dL (ref 0.3–1.2)
BUN: 18 mg/dL (ref 6–23)
CHLORIDE: 94 meq/L — AB (ref 96–112)
CO2: 27 meq/L (ref 19–32)
Calcium: 9.3 mg/dL (ref 8.4–10.5)
Creatinine, Ser: 1.02 mg/dL (ref 0.50–1.10)
GFR, EST AFRICAN AMERICAN: 63 mL/min — AB (ref >90)
GFR, EST NON AFRICAN AMERICAN: 54 mL/min — AB (ref >90)
GLUCOSE: 186 mg/dL — AB (ref 70–99)
POTASSIUM: 4.4 meq/L (ref 3.7–5.3)
SODIUM: 133 meq/L — AB (ref 137–147)
Total Protein: 8.4 g/dL — ABNORMAL HIGH (ref 6.0–8.3)

## 2013-09-27 LAB — URINALYSIS, ROUTINE W REFLEX MICROSCOPIC
BILIRUBIN URINE: NEGATIVE
Leukocytes, UA: NEGATIVE
Nitrite: NEGATIVE
PROTEIN: NEGATIVE mg/dL
Specific Gravity, Urine: 1.015 (ref 1.005–1.030)
UROBILINOGEN UA: 0.2 mg/dL (ref 0.0–1.0)
pH: 7 (ref 5.0–8.0)

## 2013-09-27 LAB — CBC WITH DIFFERENTIAL/PLATELET
BASOS ABS: 0 10*3/uL (ref 0.0–0.1)
Basophils Relative: 0 % (ref 0–1)
EOS PCT: 0 % (ref 0–5)
Eosinophils Absolute: 0 10*3/uL (ref 0.0–0.7)
HCT: 35.9 % — ABNORMAL LOW (ref 36.0–46.0)
Hemoglobin: 12.2 g/dL (ref 12.0–15.0)
LYMPHS ABS: 2 10*3/uL (ref 0.7–4.0)
LYMPHS PCT: 17 % (ref 12–46)
MCH: 29.6 pg (ref 26.0–34.0)
MCHC: 34 g/dL (ref 30.0–36.0)
MCV: 87.1 fL (ref 78.0–100.0)
Monocytes Absolute: 0.3 10*3/uL (ref 0.1–1.0)
Monocytes Relative: 2 % — ABNORMAL LOW (ref 3–12)
NEUTROS ABS: 9.5 10*3/uL — AB (ref 1.7–7.7)
NEUTROS PCT: 81 % — AB (ref 43–77)
PLATELETS: 248 10*3/uL (ref 150–400)
RBC: 4.12 MIL/uL (ref 3.87–5.11)
RDW: 14.1 % (ref 11.5–15.5)
WBC: 11.8 10*3/uL — AB (ref 4.0–10.5)

## 2013-09-27 LAB — PRO B NATRIURETIC PEPTIDE: Pro B Natriuretic peptide (BNP): 794 pg/mL — ABNORMAL HIGH (ref 0–125)

## 2013-09-27 LAB — URINE MICROSCOPIC-ADD ON

## 2013-09-27 LAB — TROPONIN I
Troponin I: <0.3 ng/mL (ref <0.30)
Troponin I: <0.3 ng/mL (ref <0.30)

## 2013-09-27 LAB — GLUCOSE, CAPILLARY
GLUCOSE-CAPILLARY: 115 mg/dL — AB (ref 70–99)
Glucose-Capillary: 161 mg/dL — ABNORMAL HIGH (ref 70–99)

## 2013-09-27 MED ORDER — HEPARIN SODIUM (PORCINE) 5000 UNIT/ML IJ SOLN
5000.0000 [IU] | Freq: Three times a day (TID) | INTRAMUSCULAR | Status: DC
  Administered 2013-09-27 – 2013-10-01 (×12): 5000 [IU] via SUBCUTANEOUS
  Filled 2013-09-27 (×12): qty 1

## 2013-09-27 MED ORDER — NORTRIPTYLINE HCL 25 MG PO CAPS
25.0000 mg | ORAL_CAPSULE | Freq: Two times a day (BID) | ORAL | Status: DC
  Administered 2013-09-27 – 2013-10-01 (×6): 25 mg via ORAL
  Filled 2013-09-27 (×6): qty 1

## 2013-09-27 MED ORDER — SODIUM CHLORIDE 0.9 % IV SOLN
INTRAVENOUS | Status: DC
  Administered 2013-09-27 – 2013-09-29 (×3): via INTRAVENOUS

## 2013-09-27 MED ORDER — DEXTROSE 5 % IV SOLN: 1.0000 g | INTRAVENOUS | Status: DC

## 2013-09-27 MED ORDER — GLIPIZIDE-METFORMIN HCL 5-500 MG PO TABS: 1.0000 | ORAL_TABLET | Freq: Two times a day (BID) | ORAL | Status: DC

## 2013-09-27 MED ORDER — LINAGLIPTIN 5 MG PO TABS
5.0000 mg | ORAL_TABLET | Freq: Every day | ORAL | Status: DC
  Administered 2013-09-28 – 2013-10-01 (×4): 5 mg via ORAL
  Filled 2013-09-27 (×4): qty 1

## 2013-09-27 MED ORDER — HYDROCHLOROTHIAZIDE 25 MG PO TABS: 25.0000 mg | ORAL_TABLET | Freq: Every day | ORAL | Status: DC | PRN

## 2013-09-27 MED ORDER — ONDANSETRON HCL 4 MG PO TABS: 4.0000 mg | ORAL_TABLET | Freq: Four times a day (QID) | ORAL | Status: DC | PRN

## 2013-09-27 MED ORDER — ONDANSETRON HCL 4 MG/2ML IJ SOLN
4.0000 mg | Freq: Four times a day (QID) | INTRAMUSCULAR | Status: DC | PRN
  Administered 2013-09-29: 4 mg via INTRAVENOUS
  Filled 2013-09-27: qty 2

## 2013-09-27 MED ORDER — SODIUM CHLORIDE 0.9 % IV BOLUS (SEPSIS)
500.0000 mL | Freq: Once | INTRAVENOUS | Status: AC
  Administered 2013-09-27: 500 mL via INTRAVENOUS

## 2013-09-27 MED ORDER — DEXTROSE 5 % IV SOLN
1.0000 g | Freq: Once | INTRAVENOUS | Status: AC
  Administered 2013-09-27: 1 g via INTRAVENOUS
  Filled 2013-09-27: qty 10

## 2013-09-27 MED ORDER — PNEUMOCOCCAL VAC POLYVALENT 25 MCG/0.5ML IJ INJ
0.5000 mL | INJECTION | INTRAMUSCULAR | Status: AC
  Administered 2013-09-28: 0.5 mL via INTRAMUSCULAR
  Filled 2013-09-27: qty 0.5

## 2013-09-27 MED ORDER — CALCIUM CARBONATE 1250 (500 CA) MG PO TABS
1250.0000 mg | ORAL_TABLET | Freq: Two times a day (BID) | ORAL | Status: DC
  Administered 2013-09-27 – 2013-10-01 (×8): 1250 mg via ORAL
  Filled 2013-09-27 (×8): qty 3

## 2013-09-27 MED ORDER — DEXTROSE 5 % IV SOLN
1.0000 g | INTRAVENOUS | Status: DC
  Administered 2013-09-28 – 2013-09-30 (×3): 1 g via INTRAVENOUS
  Filled 2013-09-27 (×5): qty 10

## 2013-09-27 MED ORDER — INSULIN ASPART 100 UNIT/ML ~~LOC~~ SOLN: 0.0000 [IU] | Freq: Every day | SUBCUTANEOUS | Status: DC

## 2013-09-27 MED ORDER — GLIPIZIDE 5 MG PO TABS
5.0000 mg | ORAL_TABLET | Freq: Two times a day (BID) | ORAL | Status: DC
  Administered 2013-09-28 – 2013-10-01 (×7): 5 mg via ORAL
  Filled 2013-09-27 (×7): qty 1

## 2013-09-27 MED ORDER — ACETAMINOPHEN 325 MG PO TABS
650.0000 mg | ORAL_TABLET | Freq: Once | ORAL | Status: AC
  Administered 2013-09-27: 650 mg via ORAL
  Filled 2013-09-27: qty 2

## 2013-09-27 MED ORDER — SIMVASTATIN 20 MG PO TABS
40.0000 mg | ORAL_TABLET | Freq: Every evening | ORAL | Status: DC
  Administered 2013-09-27 – 2013-09-30 (×4): 40 mg via ORAL
  Filled 2013-09-27 (×4): qty 2

## 2013-09-27 MED ORDER — METFORMIN HCL 500 MG PO TABS
500.0000 mg | ORAL_TABLET | Freq: Two times a day (BID) | ORAL | Status: DC
  Administered 2013-09-28 – 2013-10-01 (×7): 500 mg via ORAL
  Filled 2013-09-27 (×7): qty 1

## 2013-09-27 MED ORDER — LORAZEPAM 0.5 MG PO TABS
0.5000 mg | ORAL_TABLET | Freq: Once | ORAL | Status: AC
  Administered 2013-09-27: 0.5 mg via ORAL
  Filled 2013-09-27: qty 1

## 2013-09-27 MED ORDER — SODIUM CHLORIDE 0.9 % IJ SOLN
3.0000 mL | Freq: Two times a day (BID) | INTRAMUSCULAR | Status: DC
  Administered 2013-09-27 – 2013-09-30 (×6): 3 mL via INTRAVENOUS

## 2013-09-27 MED ORDER — INSULIN ASPART 100 UNIT/ML ~~LOC~~ SOLN
0.0000 [IU] | Freq: Three times a day (TID) | SUBCUTANEOUS | Status: DC
  Administered 2013-09-28: 3 [IU] via SUBCUTANEOUS
  Administered 2013-09-28 – 2013-10-01 (×6): 4 [IU] via SUBCUTANEOUS

## 2013-09-27 NOTE — ED Notes (Signed)
Normal last night when she went to bed.  Confused since she woke up.  Frequent urinating.  Pt very weak when ems was getting her up to stretcher.  Temp 100.3 with ems.   B/p 160/120 .  Rate 110.  Sat 92% .  cbg 261.

## 2013-09-27 NOTE — ED Notes (Signed)
Over to radiology for MRI and then to go to room.

## 2013-09-27 NOTE — ED Provider Notes (Signed)
TIME SEEN: 1:27 PM  CHIEF COMPLAINT: Altered mental status  HPI: Patient is a 73 year old female with history of hypertension, diabetes, hyperlipidemia who presents emergency department with altered mental status. Family reports that she was normal when she went to bed last night. She lives at home alone. He states this morning when they came to visit her she was confused, did not know who they were, was sitting on the recliner in her pull-ups and any T-shirt which is abnormal for her. They state she normally is oriented x3 but has had some intermittent episodes of confusion over the last several weeks that are transient never lasting more than a few minutes. They state 2 weeks ago she did have an upper respiratory infection and was coughing but this has improved. They're not aware of any head injury. Family states the patient did have some vomiting this morning. She has a temperature of 100.9 in the ED. Denies any pain. Unclear if there's been any changes in medications.  ROS: Level V caveat for altered mental status  PAST MEDICAL HISTORY/PAST SURGICAL HISTORY:  Past Medical History  Diagnosis Date  . Diabetes mellitus without complication   . Hypertension   . Hypercholesterolemia     MEDICATIONS:  Prior to Admission medications   Medication Sig Start Date End Date Taking? Authorizing Provider  calcium carbonate (OS-CAL) 600 MG TABS Take 600 mg by mouth 2 (two) times daily with a meal.   Yes Historical Provider, MD  glipiZIDE-metformin (METAGLIP) 5-500 MG per tablet Take 1 tablet by mouth 2 (two) times daily before a meal.   Yes Historical Provider, MD  hydrochlorothiazide (HYDRODIURIL) 25 MG tablet Take 25 mg by mouth daily as needed (blood pressure).   Yes Historical Provider, MD  HYDROcodone-acetaminophen (VICODIN) 5-500 MG per tablet Take 1 tablet by mouth every 4 (four) hours as needed for pain.   Yes Historical Provider, MD  linagliptin (TRADJENTA) 5 MG TABS tablet Take 5 mg by mouth  daily.   Yes Historical Provider, MD  LORazepam (ATIVAN) 0.5 MG tablet Take 0.5 mg by mouth every 4 (four) hours as needed for anxiety.   Yes Historical Provider, MD  nortriptyline (PAMELOR) 25 MG capsule Take 25 mg by mouth 2 (two) times daily.   Yes Historical Provider, MD  ondansetron (ZOFRAN) 4 MG tablet Take 4 mg by mouth every 6 (six) hours as needed for nausea.   Yes Historical Provider, MD  simvastatin (ZOCOR) 40 MG tablet Take 40 mg by mouth every evening.   Yes Historical Provider, MD    ALLERGIES:  Allergies  Allergen Reactions  . Penicillins Rash    SOCIAL HISTORY:  History  Substance Use Topics  . Smoking status: Current Some Day Smoker    Types: Cigarettes  . Smokeless tobacco: Not on file  . Alcohol Use: No    FAMILY HISTORY: History reviewed. No pertinent family history.  EXAM: BP 151/81  Pulse 112  Temp(Src) 100.9 F (38.3 C) (Oral)  Resp 31  SpO2 95% CONSTITUTIONAL: Alert and oriented person; cannot answer questions appropriately otherwise, otherwise well-appearing and nontoxic, pleasant HEAD: Normocephalic EYES: Conjunctivae clear, PERRL ENT: normal nose; no rhinorrhea; moist mucous membranes; pharynx without lesions noted NECK: Supple, no meningismus, no LAD  CARD: Again and tachycardic; S1 and S2 appreciated; no murmurs, no clicks, no rubs, no gallops RESP: Normal chest excursion without splinting or tachypnea; breath sounds equal bilaterally, no wheezing or rhonchi, patient does have mild bibasilar rales worse on the right, oxygen saturation between  90-92%, no increased work of breathing or respiratory distress, speaking full sentences ABD/GI: Normal bowel sounds; non-distended; soft, non-tender, no rebound, no guarding BACK:  The back appears normal and is non-tender to palpation, there is no CVA tenderness EXT: Normal ROM in all joints; non-tender to palpation; no edema; normal capillary refill; no cyanosis    SKIN: Normal color for age and race;  warm NEURO: Moves all extremities equally, patient reports normal sensation diffusely, cranial nerves II through XII intact PSYCH: The patient's mood and manner are appropriate. Grooming and personal hygiene are appropriate.  MEDICAL DECISION MAKING: Patient here with fever and altered mental status. Suspect UTI. Will check basic labs, chest x-ray, urine. Will give Tylenol and IV fluids. Given her neurologic exam is nonfocal, doubt stroke but will obtain a head CT. Patient will need admission. Her PCP is Dr. Megan Mans.  ED PROGRESS: Labs show leukocytosis of 11.8 with left shift. Urine shows trace hemoglobin, 21-50 white blood cells, many bacteria. Urine culture pending. Will treat with IV ceftriaxone. Chest x-ray clear. Head CT shows no acute abnormality. We'll discuss with hospitalist for admission.   3:15 PM  Spoke with Dr. Karilyn Cota for admission to medical bed, inpatient.      EKG Interpretation  Date/Time:  Tuesday September 27 2013 12:43:13 EDT Ventricular Rate:  110 PR Interval:  141 QRS Duration: 114 QT Interval:  340 QTC Calculation: 460 R Axis:   -8 Text Interpretation:  Ectopic atrial tachycardia, unifocal Atrial premature complex Anterior infarct, old Confirmed by Jazzalyn Loewenstein,  DO, Virgie Chery ([id number]) on 09/27/2013 2:50:52 PM        Layla Maw Kristi Norment, DO 09/27/13 1610

## 2013-09-27 NOTE — H&P (Signed)
[facility] History and Physical  [name]:[id number] DOB: 10-07-1940 DOA: 09/27/2013  Referring physician: ER. PCP: Alice Reichert, MD   Chief Complaint: Altered mental status.  HPI: [name] is a 73 y.o. female  This is a 73 year old lady, diabetic, hypertensive who is otherwise in good health until 7 AM this morning when she became confused. Her caregiver says that she did not know where she was and who the caregiver was. She apparently also had speech difficulties. When she came to the emergency room, she had a low-grade fever and it was felt that she had a UTI. She is now being admitted for further management.   Review of Systems:  Constitutional:  No weight loss, night sweats, chills, fatigue.  HEENT:  No headaches, Difficulty swallowing,Tooth/dental problems,Sore throat,  No sneezing, itching, ear ache, nasal congestion, post nasal drip,  Cardio-vascular:  No chest pain, Orthopnea, PND, swelling in lower extremities, anasarca, dizziness, palpitations  GI:  No heartburn, indigestion, abdominal pain, nausea, vomiting, diarrhea, change in bowel habits, loss of appetite  Resp:  No shortness of breath with exertion or at rest. No excess mucus, no productive cough, No non-productive cough, No coughing up of blood.No change in color of mucus.No wheezing.No chest wall deformity  Skin:  no rash or lesions.  GU:  no dysuria, change in color of urine, no urgency or frequency. No flank pain.  Musculoskeletal:  No joint pain or swelling. No decreased range of motion. No back pain.  Psych:  No change in mood or affect. No depression or anxiety. No memory loss.   Past Medical History  Diagnosis Date  . Diabetes mellitus without complication   . Hypertension   . Hypercholesterolemia    Past Surgical History  Procedure Laterality Date  . Abdominal hysterectomy    . Fracture surgery     Social History:  reports that she has been smoking Cigarettes.  She  has been smoking about 0.00 packs per day. She does not have any smokeless tobacco history on file. She reports that she does not drink alcohol or use illicit drugs.  Allergies  Allergen Reactions  . Penicillins Rash    History reviewed. No pertinent family history.   Prior to Admission medications   Medication Sig Start Date End Date Taking? Authorizing Provider  calcium carbonate (OS-CAL) 600 MG TABS Take 600 mg by mouth 2 (two) times daily with a meal.   Yes Historical Provider, MD  glipiZIDE-metformin (METAGLIP) 5-500 MG per tablet Take 1 tablet by mouth 2 (two) times daily before a meal.   Yes Historical Provider, MD  hydrochlorothiazide (HYDRODIURIL) 25 MG tablet Take 25 mg by mouth daily as needed (blood pressure).   Yes Historical Provider, MD  HYDROcodone-acetaminophen (VICODIN) 5-500 MG per tablet Take 1 tablet by mouth every 4 (four) hours as needed for pain.   Yes Historical Provider, MD  linagliptin (TRADJENTA) 5 MG TABS tablet Take 5 mg by mouth daily.   Yes Historical Provider, MD  LORazepam (ATIVAN) 0.5 MG tablet Take 0.5 mg by mouth every 4 (four) hours as needed for anxiety.   Yes Historical Provider, MD  nortriptyline (PAMELOR) 25 MG capsule Take 25 mg by mouth 2 (two) times daily.   Yes Historical Provider, MD  ondansetron (ZOFRAN) 4 MG tablet Take 4 mg by mouth every 6 (six) hours as needed for nausea.   Yes Historical Provider, MD  simvastatin (ZOCOR) 40 MG tablet Take 40 mg by mouth every evening.   Yes  Historical Provider, MD   Physical Exam: Filed Vitals:   09/27/13 1430 09/27/13 1509 09/27/13 1530 09/27/13 1600  BP: 105/63 119/80 129/72 122/97  Pulse: 103 100 99   Temp:  99.9 F (37.7 C)    TempSrc:  Oral    Resp: SpO2: 90% 93% 92%     Wt Readings from Last 3 Encounters:  06/21/12 86.637 kg (191 lb)  08/01/10 87 kg (191 lb 12.8 oz)    General:  Appears confused. She feels warm/hot as if she does have a fever. Eyes: PERRL, normal lids,  irises & conjunctiva ENT: grossly normal hearing, lips & tongue Neck: no LAD, masses or thyromegaly Cardiovascular: RRR, no m/r/g. No LE edema. Tachycardia.? Atrial tachycardia. Telemetry: ? Atrial tachycardia. Respiratory: CTA bilaterally, no w/r/r. Normal respiratory effort. Abdomen: soft, ntnd Skin: no rash or induration seen on limited exam Musculoskeletal: grossly normal tone BUE/BLE Psychiatric: grossly normal mood and affect, speech fluent and appropriate Neurologic: grossly non-focal.no meningism. She does have nominal dysphasia.           Labs on Admission:  Basic Metabolic Panel:  Recent Labs Lab 09/27/13 1318  NA 133*  K 4.4  CL 94*  CO2 27  GLUCOSE 186*  BUN 18  CREATININE 1.02  CALCIUM 9.3   Liver Function Tests:  Recent Labs Lab 09/27/13 1318  AST 22  ALT 13  ALKPHOS 66  BILITOT 0.3  PROT 8.4*  ALBUMIN 3.6   No results found for this basename: LIPASE, AMYLASE,  in the last 168 hours No results found for this basename: AMMONIA,  in the last 168 hours CBC:  Recent Labs Lab 09/27/13 1318  WBC 11.8*  NEUTROABS 9.5*  HGB 12.2  HCT 35.9*  MCV 87.1  PLT 248   Cardiac Enzymes:  Recent Labs Lab 09/27/13 1318  TROPONINI <0.30    BNP (last 3 results)  Recent Labs  09/27/13 1318  PROBNP 794.0*   CBG: No results found for this basename: GLUCAP,  in the last 168 hours  Radiological Exams on Admission: Ct Head Wo Contrast  09/27/2013   CLINICAL DATA:  Altered mental status.  EXAM: CT HEAD WITHOUT CONTRAST  TECHNIQUE: Contiguous axial images were obtained from the base of the skull through the vertex without intravenous contrast.  COMPARISON:  None.  FINDINGS: No intra-axial or extra-axial pathologic fluid of blood collection. Severe white matter changes noted consistent with chronic ischemia. 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 No acute bony abnormality. Mild mucosal thickening of the paranasal sinuses  IMPRESSION: No acute abnormality.   Chronic ischemic change.   Electronically Signed   By: Maisie Fus  Register   On: 09/27/2013 14:57   Dg Chest Portable 1 View  09/27/2013   CLINICAL DATA:  Altered mental status.  EXAM: PORTABLE CHEST - 1 VIEW  COMPARISON:  PA and lateral chest 06/21/2012 and 08/02/2010.  FINDINGS: There is cardiomegaly without edema. Subsegmental atelectasis is seen in the left lung base. The right lung is clear. No pneumothorax is identified.  IMPRESSION: Cardiomegaly without acute disease.   Electronically Signed   By: Drusilla Kanner M.D.   On: 09/27/2013 13:00    EKG: Independently reviewed. No acute ST-T wave changes,? Atrial tachycardia.  Assessment/Plan   1. Altered mental status, I believe this is multifactorial. 2. UTI with fever. 3. Dysphasia, nominal, concerning for CVA. 4. Hypertension. 5. Diabetes.  Plan: 1. Admit. 2. MRI brain scan. 3. Treat empirically with intravenous antibiotics for UTI. 4. IV  fluids. 5. Monitor and control diabetes. 6. Consider neurology consultation.  Further recommendations will depend on patient's hospital progress.   Code Status: Full code.   DVT Prophylaxis: Heparin.  Family Communication: I discussed the plan with the patient's caregiver at the bedside.  Disposition Plan: Home when medically stable.   Time spent: 60 minutes.  Wilson Singer [facility] Pager [phone].  **Disclaimer: This note may have been dictated with voice recognition software. Similar sounding words can inadvertently be transcribed and this note may contain transcription errors which may not have been corrected upon publication of note.**

## 2013-09-27 NOTE — Progress Notes (Signed)
Family requesting to talk with doctor regarding results of MRI. Paged [facility] regarding this. Awaiting to hear back from physician.  Assessment completed. Neuro check done. LLE weaker than right. Pt is A&O at this time. X2 family members @ bedside. Will monitor pt frequently throughout night. Bed is in lowest position & call bell is within reach.

## 2013-09-28 ENCOUNTER — Inpatient Hospital Stay (HOSPITAL_COMMUNITY): Payer: Medicare Other

## 2013-09-28 DIAGNOSIS — N390 Urinary tract infection, site not specified: Secondary | ICD-10-CM | POA: Diagnosis not present

## 2013-09-28 LAB — CBC
HCT: 34.5 % — ABNORMAL LOW (ref 36.0–46.0)
Hemoglobin: 11.6 g/dL — ABNORMAL LOW (ref 12.0–15.0)
MCH: 29.7 pg (ref 26.0–34.0)
MCHC: 33.6 g/dL (ref 30.0–36.0)
MCV: 88.2 fL (ref 78.0–100.0)
Platelets: 213 10*3/uL (ref 150–400)
RBC: 3.91 MIL/uL (ref 3.87–5.11)
RDW: 14.2 % (ref 11.5–15.5)
WBC: 9.4 10*3/uL (ref 4.0–10.5)

## 2013-09-28 LAB — COMPREHENSIVE METABOLIC PANEL
ALK PHOS: 53 U/L (ref 39–117)
ALT: 10 U/L (ref 0–35)
AST: 20 U/L (ref 0–37)
Albumin: 3 g/dL — ABNORMAL LOW (ref 3.5–5.2)
Anion gap: 11 (ref 5–15)
BILIRUBIN TOTAL: 0.3 mg/dL (ref 0.3–1.2)
BUN: 16 mg/dL (ref 6–23)
CALCIUM: 9 mg/dL (ref 8.4–10.5)
CHLORIDE: 100 meq/L (ref 96–112)
CO2: 27 meq/L (ref 19–32)
Creatinine, Ser: 1 mg/dL (ref 0.50–1.10)
GFR, EST AFRICAN AMERICAN: 65 mL/min — AB (ref >90)
GFR, EST NON AFRICAN AMERICAN: 56 mL/min — AB (ref >90)
GLUCOSE: 118 mg/dL — AB (ref 70–99)
Potassium: 4 mEq/L (ref 3.7–5.3)
SODIUM: 138 meq/L (ref 137–147)
Total Protein: 7.2 g/dL (ref 6.0–8.3)

## 2013-09-28 LAB — GLUCOSE, CAPILLARY
GLUCOSE-CAPILLARY: 182 mg/dL — AB (ref 70–99)
GLUCOSE-CAPILLARY: 177 mg/dL — AB (ref 70–99)
Glucose-Capillary: 133 mg/dL — ABNORMAL HIGH (ref 70–99)
Glucose-Capillary: 143 mg/dL — ABNORMAL HIGH (ref 70–99)

## 2013-09-28 LAB — TROPONIN I: Troponin I: <0.3 ng/mL (ref <0.30)

## 2013-09-28 LAB — TSH: TSH: 2.33 u[IU]/mL (ref 0.350–4.500)

## 2013-09-28 MED ORDER — ASPIRIN 325 MG PO TABS
325.0000 mg | ORAL_TABLET | Freq: Every day | ORAL | Status: DC
  Administered 2013-09-28 – 2013-10-01 (×4): 325 mg via ORAL
  Filled 2013-09-28 (×4): qty 1

## 2013-09-28 NOTE — Consult Note (Signed)
[place] A. Merlene Laughter, MD     [email or web address]          [name] is an 73 y.o. female.   ASSESSMENT/PLAN: 1. Acute encephalopathy. Most likely etiology is from toxic metabolic causes from her acute Urinary track infection.  2. 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 These infarcts are small and I doubt there Contributing significantly to the encephalopathy. The patient will be placed on aspirin 325. Additional workup will be undertaken including MRA of the brain, homocysteine level, and TSH. Additionally echo and carotid will be obtained.  3. Gait impairment of unclear etiology but likely multifactorial including marked osteoarthritis, neuropathy and aging. Orthostatics will be obtained. Also suggest physical therapy.  This is a 73 year old female who presents with acute altered mental status and slurred speech. Workup has been significant for low-grade fever and urinary tract infection. She is being treated properly. Head CT scan was unremarkable but MRI shows a few small infarcts on the left side. The patient reports that she is feeling a lot better. She seems not to be able to provide history as to why she is in the hospital. She still has significant lapses in memory. She denies chest pain, shortness of breath or focal weakness. She reports that she has been falling a lot recently due to dizziness. The dizziness is described as lightheadedness and disequilibrium. She denies chest pain, shortness of breath, focal numbness or weakness. She does report that she has been having some headaches especially falling a lot. The headaches are located in the frontal region bilaterally.  GENERAL: This a pleasant obese female in no acute distress.  HEENT: Supple. Atraumatic normocephalic. There is no scalp tenderness.  ABDOMEN: soft  EXTREMITIES: No edema. There is rather severe arthritic changes of knees and elbows and other joints. She does seem to  have pain on palpation of the legs bilaterally.   BACK: Normal.  SKIN: Normal by inspection.    MENTAL STATUS: She is awake and alert. She has poor insight into her medical condition at this time. She is most recurrent to elicit however. No dysarthria is noted at this time. She follows commands briskly.  CRANIAL NERVES: Pupils are equal, round and reactive to light and accommodation; extra ocular movements are full, there is no significant nystagmus; visual fields are full; upper and lower facial muscles are normal in strength and symmetric, there is no flattening of the nasolabial folds; tongue is midline; uvula is midline; shoulder elevation is normal.  MOTOR: Normal tone, bulk and strength; no pronator drift.  COORDINATION: Left finger to nose is normal, right finger to nose is normal, No rest tremor; no intention tremor; no postural tremor; no bradykinesia.  REFLEXES: Deep tendon reflexes are symmetrical and normal but diminished 1+ in the legs. Babinski reflexes are flexor bilaterally.   SENSATION: Normal to light touch.    Past Medical History  Diagnosis Date  . Diabetes mellitus without complication   . Hypertension   . Hypercholesterolemia     Past Surgical History  Procedure Laterality Date  . Abdominal hysterectomy    . Fracture surgery      History reviewed. No pertinent family history.  Social History:  reports that she has been smoking Cigarettes.  She has been smoking about 0.00 packs per day. She does not have any smokeless tobacco history on file. She reports that she does not drink alcohol or use illicit drugs.  Allergies:  Allergies  Allergen Reactions  . Penicillins  Rash    Medications: Prior to Admission medications   Medication Sig Start Date End Date Taking? Authorizing Provider  calcium carbonate (OS-CAL) 600 MG TABS Take 600 mg by mouth 2 (two) times daily with a meal.   Yes Historical Provider, MD  glipiZIDE-metformin (METAGLIP) 5-500 MG per  tablet Take 1 tablet by mouth 2 (two) times daily before a meal.   Yes Historical Provider, MD  hydrochlorothiazide (HYDRODIURIL) 25 MG tablet Take 25 mg by mouth daily as needed (blood pressure).   Yes Historical Provider, MD  HYDROcodone-acetaminophen (VICODIN) 5-500 MG per tablet Take 1 tablet by mouth every 4 (four) hours as needed for pain.   Yes Historical Provider, MD  linagliptin (TRADJENTA) 5 MG TABS tablet Take 5 mg by mouth daily.   Yes Historical Provider, MD  LORazepam (ATIVAN) 0.5 MG tablet Take 0.5 mg by mouth every 4 (four) hours as needed for anxiety.   Yes Historical Provider, MD  nortriptyline (PAMELOR) 25 MG capsule Take 25 mg by mouth 2 (two) times daily.   Yes Historical Provider, MD  ondansetron (ZOFRAN) 4 MG tablet Take 4 mg by mouth every 6 (six) hours as needed for nausea.   Yes Historical Provider, MD  simvastatin (ZOCOR) 40 MG tablet Take 40 mg by mouth every evening.   Yes Historical Provider, MD    Scheduled Meds: . calcium carbonate  1,250 mg Oral BID WC  . cefTRIAXone (ROCEPHIN)  IV  1 g Intravenous Q24H  . glipiZIDE  5 mg Oral BID AC   And  . metFORMIN  500 mg Oral BID AC  . heparin  5,000 Units Subcutaneous 3 times per day  . insulin aspart  0-20 Units Subcutaneous TID WC  . insulin aspart  0-5 Units Subcutaneous QHS  . linagliptin  5 mg Oral Daily  . nortriptyline  25 mg Oral BID  . simvastatin  40 mg Oral QPM  . sodium chloride  3 mL Intravenous Q12H   Continuous Infusions: . sodium chloride 125 mL/hr at 09/28/13 1819   PRN Meds:.hydrochlorothiazide, ondansetron (ZOFRAN) IV, ondansetron, ondansetron   Blood pressure 115/62, pulse 105, temperature 97.8 F (36.6 C), temperature source Oral, resp. rate 20, height _0  (1.626 m), weight 82.1 kg (181 lb), SpO2 98.00%.   Results for orders placed during the hospital encounter of 09/27/13 (from the past 48 hour(s))  URINALYSIS, ROUTINE W REFLEX MICROSCOPIC     Status: Abnormal   Collection Time     09/27/13  1:00 PM      Result Value Ref Range   Color, Urine YELLOW  YELLOW   APPearance HAZY (*) CLEAR   Specific Gravity, Urine 1.015  1.005 - 1.030   pH 7.0  5.0 - 8.0   Glucose, UA >1000 (*) NEGATIVE mg/dL   Hgb urine dipstick TRACE (*) NEGATIVE   Bilirubin Urine NEGATIVE  NEGATIVE   Ketones, ur TRACE (*) NEGATIVE mg/dL   Protein, ur NEGATIVE  NEGATIVE mg/dL   Urobilinogen, UA 0.2  0.0 - 1.0 mg/dL   Nitrite NEGATIVE  NEGATIVE   Leukocytes, UA NEGATIVE  NEGATIVE  URINE MICROSCOPIC-ADD ON     Status: Abnormal   Collection Time    09/27/13  1:00 PM      Result Value Ref Range   WBC, UA 21-50  <3 WBC/hpf   RBC / HPF 0-2  <3 RBC/hpf   Bacteria, UA MANY (*) RARE   Urine-Other YEAST    URINE CULTURE     Status: None  Collection Time    09/27/13  1:00 PM      Result Value Ref Range   Specimen Description URINE, CATHETERIZED     Special Requests NONE     Culture  Setup Time       Value: 09/27/2013 18:16     Performed at [facility]       Value: >=100,000 COLONIES/ML     Performed at [facility]   Culture       Value: ESCHERICHIA COLI     Performed at [facility]   Report Status PENDING    CBC WITH DIFFERENTIAL     Status: Abnormal   Collection Time    09/27/13  1:18 PM      Result Value Ref Range   WBC 11.8 (*) 4.0 - 10.5 K/uL   RBC 4.12  3.87 - 5.11 MIL/uL   Hemoglobin 12.2  12.0 - 15.0 g/dL   HCT 35.9 (*) 36.0 - 46.0 %   MCV 87.1  78.0 - 100.0 fL   MCH 29.6  26.0 - 34.0 pg   MCHC 34.0  30.0 - 36.0 g/dL   RDW 14.1  11.5 - 15.5 %   Platelets 248  150 - 400 K/uL   Neutrophils Relative % 81 (*) 43 - 77 %   Neutro Abs 9.5 (*) 1.7 - 7.7 K/uL   Lymphocytes Relative 17  12 - 46 %   Lymphs Abs 2.0  0.7 - 4.0 K/uL   Monocytes Relative 2 (*) 3 - 12 %   Monocytes Absolute 0.3  0.1 - 1.0 K/uL   Eosinophils Relative 0  0 - 5 %   Eosinophils Absolute 0.0  0.0 - 0.7 K/uL   Basophils Relative 0  0 - 1 %   Basophils Absolute 0.0  0.0 -  0.1 K/uL  COMPREHENSIVE METABOLIC PANEL     Status: Abnormal   Collection Time    09/27/13  1:18 PM      Result Value Ref Range   Sodium 133 (*) 137 - 147 mEq/L   Potassium 4.4  3.7 - 5.3 mEq/L   Chloride 94 (*) 96 - 112 mEq/L   CO2 27  19 - 32 mEq/L   Glucose, Bld 186 (*) 70 - 99 mg/dL   BUN 18  6 - 23 mg/dL   Creatinine, Ser 1.02  0.50 - 1.10 mg/dL   Calcium 9.3  8.4 - 10.5 mg/dL   Total Protein 8.4 (*) 6.0 - 8.3 g/dL   Albumin 3.6  3.5 - 5.2 g/dL   AST 22  0 - 37 U/L   ALT 13  0 - 35 U/L   Alkaline Phosphatase 66  39 - 117 U/L   Total Bilirubin 0.3  0.3 - 1.2 mg/dL   GFR calc non Af Amer 54 (*) >90 mL/min   GFR calc Af Amer 63 (*) >90 mL/min   Comment: (NOTE)     The eGFR has been calculated using the CKD EPI equation.     This calculation has not been validated in all clinical situations.     eGFR's persistently <90 mL/min signify possible Chronic Kidney     Disease.   Anion gap 12  5 - 15  PRO B NATRIURETIC PEPTIDE     Status: Abnormal   Collection Time    09/27/13  1:18 PM      Result Value Ref Range   Pro B Natriuretic peptide (BNP) 794.0 (*)  0 - 125 pg/mL  TROPONIN I     Status: None   Collection Time    09/27/13  1:18 PM      Result Value Ref Range   Troponin I <0.30  <0.30 ng/mL   Comment:            Due to the release kinetics of cTnI,     a negative result within the first hours     of the onset of symptoms does not rule out     myocardial infarction with certainty.     If myocardial infarction is still suspected,     repeat the test at appropriate intervals.  TSH     Status: None   Collection Time    09/27/13  4:31 PM      Result Value Ref Range   TSH 2.330  0.350 - 4.500 uIU/mL   Comment: Performed at [facility]  TROPONIN I     Status: None   Collection Time    09/27/13  4:31 PM      Result Value Ref Range   Troponin I <0.30  <0.30 ng/mL   Comment:            Due to the release kinetics of cTnI,     a negative result within the first  hours     of the onset of symptoms does not rule out     myocardial infarction with certainty.     If myocardial infarction is still suspected,     repeat the test at appropriate intervals.  GLUCOSE, CAPILLARY     Status: Abnormal   Collection Time    09/27/13  5:35 PM      Result Value Ref Range   Glucose-Capillary 115 (*) 70 - 99 mg/dL  GLUCOSE, CAPILLARY     Status: Abnormal   Collection Time    09/27/13  9:27 PM      Result Value Ref Range   Glucose-Capillary 161 (*) 70 - 99 mg/dL   Comment 1 Notify RN     Comment 2 Documented in Chart    TROPONIN I     Status: None   Collection Time    09/27/13 10:15 PM      Result Value Ref Range   Troponin I <0.30  <0.30 ng/mL   Comment:            Due to the release kinetics of cTnI,     a negative result within the first hours     of the onset of symptoms does not rule out     myocardial infarction with certainty.     If myocardial infarction is still suspected,     repeat the test at appropriate intervals.  TROPONIN I     Status: None   Collection Time    09/28/13  4:22 AM      Result Value Ref Range   Troponin I <0.30  <0.30 ng/mL   Comment:            Due to the release kinetics of cTnI,     a negative result within the first hours     of the onset of symptoms does not rule out     myocardial infarction with certainty.     If myocardial infarction is still suspected,     repeat the test at appropriate intervals.  COMPREHENSIVE METABOLIC PANEL     Status: Abnormal   Collection Time    09/28/13  4:22 AM      Result Value Ref Range   Sodium 138  137 - 147 mEq/L   Potassium 4.0  3.7 - 5.3 mEq/L   Chloride 100  96 - 112 mEq/L   CO2 27  19 - 32 mEq/L   Glucose, Bld 118 (*) 70 - 99 mg/dL   BUN 16  6 - 23 mg/dL   Creatinine, Ser 1.00  0.50 - 1.10 mg/dL   Calcium 9.0  8.4 - 10.5 mg/dL   Total Protein 7.2  6.0 - 8.3 g/dL   Albumin 3.0 (*) 3.5 - 5.2 g/dL   AST 20  0 - 37 U/L   ALT 10  0 - 35 U/L   Alkaline Phosphatase 53  39  - 117 U/L   Total Bilirubin 0.3  0.3 - 1.2 mg/dL   GFR calc non Af Amer 56 (*) >90 mL/min   GFR calc Af Amer 65 (*) >90 mL/min   Comment: (NOTE)     The eGFR has been calculated using the CKD EPI equation.     This calculation has not been validated in all clinical situations.     eGFR's persistently <90 mL/min signify possible Chronic Kidney     Disease.   Anion gap 11  5 - 15  CBC     Status: Abnormal   Collection Time    09/28/13  4:22 AM      Result Value Ref Range   WBC 9.4  4.0 - 10.5 K/uL   RBC 3.91  3.87 - 5.11 MIL/uL   Hemoglobin 11.6 (*) 12.0 - 15.0 g/dL   HCT 34.5 (*) 36.0 - 46.0 %   MCV 88.2  78.0 - 100.0 fL   MCH 29.7  26.0 - 34.0 pg   MCHC 33.6  30.0 - 36.0 g/dL   RDW 14.2  11.5 - 15.5 %   Platelets 213  150 - 400 K/uL  GLUCOSE, CAPILLARY     Status: Abnormal   Collection Time    09/28/13  8:04 AM      Result Value Ref Range   Glucose-Capillary 133 (*) 70 - 99 mg/dL   Comment 1 Notify RN    GLUCOSE, CAPILLARY     Status: Abnormal   Collection Time    09/28/13 11:30 AM      Result Value Ref Range   Glucose-Capillary 182 (*) 70 - 99 mg/dL  GLUCOSE, CAPILLARY     Status: Abnormal   Collection Time    09/28/13  4:32 PM      Result Value Ref Range   Glucose-Capillary 177 (*) 70 - 99 mg/dL   Comment 1 Notify RN     Comment 2 Documented in Chart      STUDIES:  MRI Several small cortically based foci of restricted diffusion in the  superior left occipital lobe (series 101, image 4) with perhaps mild  involvement of subcortical white matter. No associated mass effect  or hemorrhage.  No contralateral or posterior fossa restricted diffusion. Major  intracranial vascular flow voids are preserved. There is a mild  degree of intracranial artery dolichoectasia.  No midline shift, mass effect, evidence of mass lesion,  ventriculomegaly, extra-axial collection or acute intracranial  hemorrhage. Pituitary within normal limits. Degenerative ligamentous  hypertrophy  about the odontoid resulting in mild stenosis at the  cervicomedullary junction. Patchy and confluent T2 and FLAIR  hyperintensity in the cerebral white matter (greater on the left) in  the bilateral thalami (series 6,  image 12) and in the central pons.  No superimposed cortical encephalomalacia identified. Cerebellum is  within normal limits for age.  Visible internal auditory structures appear normal. Trace mastoid  fluid. Mild paranasal sinus mucosal thickening, primarily in the  left maxillary sinus. Postoperative changes to both globes.  Visualized scalp soft tissues are within normal limits. Normal bone  marrow signal.  IMPRESSION:  1. 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 No mass  effect or hemorrhage.  2. Moderately advanced but nonspecific signal changes in the  cerebral white matter (greater in the left hemisphere), thalami and  pons.  3. Mild intracranial artery dolichoectasia.  4. Mild degenerative cervicomedullary junction stenosis.      Fianna Snowball A. Merlene Laughter, M.D.  Diplomate, Tax adviser of Psychiatry and Neurology ( Neurology). 09/28/2013, 8:29 PM

## 2013-09-28 NOTE — Care Management Note (Addendum)
    Page 1 of 2   09/30/2013     11:24:43 AM CARE MANAGEMENT NOTE 09/30/2013  Patient:  [name], [name]   Account Number:  [id number]  Date Initiated:  09/28/2013  Documentation initiated by:  Sharrie Rothman  Subjective/Objective Assessment:   Pt admitted from home with CVA and UTI. Pt lives alone and has a CAP aide M-Sat, 8-1. Pt has a cane, walker, and shower seat. Pt has a close friend that checks on her daily.     Action/Plan:   Pt is agreeable to [facility] RN and PT with [facility]. Will continue to follow for discharge planning needs. Pt refuses placement at this time.   Anticipated DC Date:  10/02/2013   Anticipated DC Plan:  HOME W HOME HEALTH SERVICES      DC Planning Services  CM consult      [facility] Choice  HOME HEALTH   Choice offered to / List presented to:  [name]        HH arranged  HH-1 RN  HH-2 PT      [facility] Dt agency  [facility]   Status of service:  Completed, signed off Medicare Important Message given?  YES (If response is "NO", the following Medicare IM given date fields will be blank) Date Medicare IM given:  09/30/2013 Medicare IM given by:  [name] Date Additional Medicare IM given:   Additional Medicare IM given by:    Discharge Disposition:  HOME W HOME HEALTH SERVICES  Per UR Regulation:    If discussed at Long Length of Stay Meetings, dates discussed:    Comments:  09/30/2013 1120 Nicole Sheriff, RN, MSN, PCCN 09/29/2013 1400 Nicole Sheriff, RN, MSN, PCCN Pt is agreeable to [facility] RN and PT. [name], of [facility], per pt's choice, has been notified of [facility] referral. Pt notified that [facility] will have 48 hours to make first visit following discharge. Pt has no further CM needs at this time.  09/28/13 1150 Nicole Queen, RN BSN CM

## 2013-09-28 NOTE — Plan of Care (Signed)
Problem: Phase I Progression Outcomes Goal: OOB as tolerated unless otherwise ordered Outcome: Completed/Met Date Met:  09/28/13 Out of bed to chair today.

## 2013-09-28 NOTE — Progress Notes (Signed)
UR chart review completed.  

## 2013-09-28 NOTE — Progress Notes (Signed)
Subjective: The patient is alert and oriented today. She came to the emergency room with low-grade fever and associated urinary tract infection and was subsequently admitted  Objective: Vital signs in last 24 hours: Temp:  [97.7 F (36.5 C)-100.9 F (38.3 C)] 97.7 F (36.5 C) (09/02 0500) Pulse Rate:  [73-112] 84 (09/02 0500) Resp:  [18-31] 18 (09/02 0500) BP: (105-151)/(55-97) 139/80 mmHg (09/02 0500) SpO2:  [90 %-96 %] 96 % (09/02 0500) Weight:  [82.1 kg (181 lb)] 82.1 kg (181 lb) (09/01 1741) Weight change:  Last BM Date: 09/27/13  Intake/Output from previous day: 09/01 0701 - 09/02 0700 In: -  Out: 650 [Urine:650] Intake/Output this shift:    Physical Exam: General appearance well-developed alert female HEENT negative  Neck supple no JVD or thyroid abnormalities  Heart regular rhythm no murmurs  Lungs clear to P&A  Abdomen no palpable organs or masses  Recent Labs  09/27/13 1318 09/28/13 0422  WBC 11.8* 9.4  HGB 12.2 11.6*  HCT 35.9* 34.5*  PLT 248 213   BMET  Recent Labs  09/27/13 1318 09/28/13 0422  NA 133* 138  K 4.4 4.0  CL 94* 100  CO2 27 27  GLUCOSE 186* 118*  BUN 18 16  CREATININE 1.02 1.00  CALCIUM 9.3 9.0    Studies/Results: Ct Head Wo Contrast  09/27/2013   CLINICAL DATA:  Altered mental status.  EXAM: CT HEAD WITHOUT CONTRAST  TECHNIQUE: Contiguous axial images were obtained from the base of the skull through the vertex without intravenous contrast.  COMPARISON:  None.  FINDINGS: No intra-axial or extra-axial pathologic fluid of blood collection. Severe white matter changes noted consistent with chronic ischemia. 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 No acute bony abnormality. Mild mucosal thickening of the paranasal sinuses  IMPRESSION: No acute abnormality.  Chronic ischemic change.   Electronically Signed   By: Maisie Fus  Register   On: 09/27/2013 14:57   Mr Brain Wo Contrast  09/27/2013   CLINICAL DATA:  73 year old female with weakness,  altered mental status, dysphagia. Initial encounter.  EXAM: MRI HEAD WITHOUT CONTRAST  TECHNIQUE: Multiplanar, multiecho pulse sequences of the brain and surrounding structures were obtained without intravenous contrast.  COMPARISON:  Head CT without contrast 1449 hr today and earlier.  FINDINGS: Several small cortically based foci of restricted diffusion in the superior left occipital lobe (series 101, image 4) with perhaps mild involvement of subcortical white matter. No associated mass effect or hemorrhage.  No contralateral or posterior fossa restricted diffusion. Major intracranial vascular flow voids are preserved. There is a mild degree of intracranial artery dolichoectasia.  No midline shift, mass effect, evidence of mass lesion, ventriculomegaly, extra-axial collection or acute intracranial hemorrhage. Pituitary within normal limits. Degenerative ligamentous hypertrophy about the odontoid resulting in mild stenosis at the cervicomedullary junction. Patchy and confluent T2 and FLAIR hyperintensity in the cerebral white matter (greater on the left) in the bilateral thalami (series 6, image 12) and in the central pons. No superimposed cortical encephalomalacia identified. Cerebellum is within normal limits for age.  Visible internal auditory structures appear normal. Trace mastoid fluid. Mild paranasal sinus mucosal thickening, primarily in the left maxillary sinus. Postoperative changes to both globes. Visualized scalp soft tissues are within normal limits. Normal bone marrow signal.  IMPRESSION: 1. 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 No mass effect or hemorrhage. 2. Moderately advanced but nonspecific signal changes in the cerebral white matter (greater in the left hemisphere), thalami and pons. 3. Mild intracranial artery dolichoectasia.  4. Mild degenerative cervicomedullary junction stenosis.   Electronically Signed   By: Augusto Gamble M.D.   On: 09/27/2013 17:14   Dg Chest Portable 1  View  09/27/2013   CLINICAL DATA:  Altered mental status.  EXAM: PORTABLE CHEST - 1 VIEW  COMPARISON:  PA and lateral chest 06/21/2012 and 08/02/2010.  FINDINGS: There is cardiomegaly without edema. Subsegmental atelectasis is seen in the left lung base. The right lung is clear. No pneumothorax is identified.  IMPRESSION: Cardiomegaly without acute disease.   Electronically Signed   By: Drusilla Kanner M.D.   On: 09/27/2013 13:00    Medications:  . calcium carbonate  1,250 mg Oral BID WC  . cefTRIAXone (ROCEPHIN)  IV  1 g Intravenous Q24H  . glipiZIDE  5 mg Oral BID AC   And  . metFORMIN  500 mg Oral BID AC  . heparin  5,000 Units Subcutaneous 3 times per day  . insulin aspart  0-20 Units Subcutaneous TID WC  . insulin aspart  0-5 Units Subcutaneous QHS  . linagliptin  5 mg Oral Daily  . nortriptyline  25 mg Oral BID  . pneumococcal 23 valent vaccine  0.5 mL Intramuscular Tomorrow-1000  . simvastatin  40 mg Oral QPM  . sodium chloride  3 mL Intravenous Q12H    . sodium chloride 125 mL/hr at 09/27/13 1846     Assessment/Plan: 1 altered mental status urinary tract infection plan to continue IV fluids and IV Rocephin  2 diabetes mellitus type 2 plan to continue metformin and insulin coverage  To complete MRI scan   LOS: 1 day   Maddock Finigan G 09/28/2013, 6:37 AM

## 2013-09-28 NOTE — Evaluation (Signed)
Physical Therapy Evaluation Patient Details Name: [name] MRN: [id number] DOB: [birth year] Today's Date: 09/28/2013   History of Present Illness  This is a 73 year old lady, diabetic, hypertensive who is otherwise in good health until 7 AM this morning when she became confused. Her caregiver says that she did not know where she was and who the caregiver was. She apparently also had speech difficulties. When she came to the emergency room, she had a low-grade fever and it was felt that she had a UTI. She is now being admitted for further management.  Imaging of the brain reveals IMPRESSION: 1. 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 No mass effect or hemorrhage. 2. Moderately advanced but nonspecific signal changes in the cerebral white matter (greater in the left hemisphere), thalami and pons. 3. Mild intracranial artery dolichoectasia. 4. Mild degenerative cervicomedullary junction stenosis.   Clinical Impression  Pt is a 73 year old female who presents to PT with dx of UTI.   Imaging of brain reveals several small acute infarcts in the Lt occipital pole.  During evaluation, pt was mod (I) with bed mobility skills, min guard/supervision for transfers, and min guard and use of RW for gait of 20 feet.  Pt required multiple VC throughout evaluation to complete tasks as pt was easily distracted (cueing to put on socks, sit at EOB, etc.).  No overt LOB with ambulation in room with use of RW.  Educated pt on recommendation for gait with RW for safety, as pt reports she normally uses a std cane in the home, and will work with PT to address gait with std cane.  Recommend continued PT while in the hospital to address strengthening, activity tolerance, and balance for improved functional mobility skills with transition to ALF with HHPT or home with HHPT at discharge.  No DME recommendations at this time.                                   Follow Up Recommendations Other (comment) (ALF with  HHPT vs. Home with HHPT)    Equipment Recommendations  None recommended by PT       Precautions / Restrictions Precautions Precautions: Fall Restrictions Weight Bearing Restrictions: No      Mobility  Bed Mobility Overal bed mobility: Modified Independent                Transfers Overall transfer level: Needs assistance Equipment used: Rolling walker (2 wheeled) Transfers: Sit to/from [employer] Sit to Stand: Supervision Stand pivot transfers: Min guard          Ambulation/Gait Ambulation/Gait assistance: Min guard Ambulation Distance (Feet): 20 Feet Assistive device: Rolling walker (2 wheeled) Gait Pattern/deviations: Step-through pattern;Decreased step length - right;Decreased step length - left;Decreased dorsiflexion - right;Decreased dorsiflexion - left   Gait velocity interpretation: Below normal speed for age/gender       Balance Overall balance assessment: Needs assistance Sitting-balance support: Feet supported;Single extremity supported Sitting balance-Leahy Scale: Good     Standing balance support: Bilateral upper extremity supported;During functional activity Standing balance-Leahy Scale: Fair                               Pertinent Vitals/Pain Pain Assessment: Faces Faces Pain Scale: Hurts a little bit Pain Location: LBP Pain Intervention(s): Limited activity within patient's tolerance;Repositioned    Home Living Family/patient  expects to be discharged to:: Private residence Living Arrangements: Alone Available Help at Discharge: Personal care attendant (Per pt, aide 6 days week from 8-1) Type of Home: Mobile home Home Access: Stairs to enter Entrance Stairs-Rails: Left Entrance Stairs-Number of Steps: 1 Home Layout: One level Home Equipment: Walker - 2 wheels;Cane - single point;Shower seat Additional Comments: Youth worker    Prior Function Level of Independence: Needs assistance   Gait / Transfers  Assistance Needed: Pt reports she is mod (I) with bed mobility skills, transfers, and household ambulation with std cane.  Pt reprots she uses a RW for community ambulation distances.   ADL's / Homemaking Assistance Needed: Pt reports aide assists with cleaning, bathing, cooking, shopping           Extremity/Trunk Assessment               Lower Extremity Assessment: Generalized weakness         Communication   Communication: No difficulties  Cognition Arousal/Alertness: Awake/alert Behavior During Therapy: WFL for tasks assessed/performed Overall Cognitive Status: No family/caregiver present to determine baseline cognitive functioning                      General Comments      Exercises        Assessment/Plan    PT Assessment Patient needs continued PT services  PT Diagnosis Abnormality of gait;Generalized weakness   PT Problem List Decreased strength;Decreased activity tolerance;Decreased mobility  PT Treatment Interventions Gait training;Functional mobility training;Therapeutic activities;Therapeutic exercise;Stair training   PT Goals (Current goals can be found in the Care Plan section) Acute Rehab PT Goals Patient Stated Goal: none stated    Frequency Min 3X/week    End of Session Equipment Utilized During Treatment: Gait belt Activity Tolerance: Patient tolerated treatment well Patient left: in chair;with call bell/phone within reach;with chair alarm set Nurse Communication: Mobility status (Mobility status to NT)         Time: 2130-8657 PT Time Calculation (min): 26 min   Charges:   PT Evaluation $Initial PT Evaluation Tier I: 1 Procedure      [name] 09/28/2013, 10:42 AM

## 2013-09-29 ENCOUNTER — Encounter (HOSPITAL_COMMUNITY): Payer: Self-pay | Admitting: Radiology

## 2013-09-29 ENCOUNTER — Inpatient Hospital Stay (HOSPITAL_COMMUNITY): Payer: Medicare Other

## 2013-09-29 LAB — GLUCOSE, CAPILLARY
Glucose-Capillary: 153 mg/dL — ABNORMAL HIGH (ref 70–99)
Glucose-Capillary: 108 mg/dL — ABNORMAL HIGH (ref 70–99)
Glucose-Capillary: 78 mg/dL (ref 70–99)
Glucose-Capillary: 79 mg/dL (ref 70–99)

## 2013-09-29 LAB — URINE CULTURE

## 2013-09-29 LAB — VITAMIN B12: VITAMIN B 12: 644 pg/mL (ref 211–911)

## 2013-09-29 LAB — HOMOCYSTEINE: HOMOCYSTEINE-NORM: 14.9 umol/L (ref 4.0–15.4)

## 2013-09-29 LAB — C-REACTIVE PROTEIN: CRP: 0.5 mg/dL — ABNORMAL LOW (ref <0.60)

## 2013-09-29 MED ORDER — ACETAMINOPHEN 325 MG PO TABS
650.0000 mg | ORAL_TABLET | ORAL | Status: DC | PRN
  Administered 2013-09-29 – 2013-10-01 (×2): 650 mg via ORAL
  Filled 2013-09-29 (×2): qty 2

## 2013-09-29 MED ORDER — HYDROCHLOROTHIAZIDE 25 MG PO TABS
25.0000 mg | ORAL_TABLET | Freq: Every day | ORAL | Status: DC
  Administered 2013-09-29 – 2013-10-01 (×3): 25 mg via ORAL
  Filled 2013-09-29 (×3): qty 1

## 2013-09-29 NOTE — Progress Notes (Signed)
Patient ID: [name], female   DOB: [birth year], 73 y.o.   MRN: [id number]  [place] A. Merlene Laughter, MD     [email or web address]          [name] is an 73 y.o. female.   Assessment/Plan: 1. Acute encephalopathy. Most likely etiology is from toxic metabolic causes from her acute Urinary track infection.   2. 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 These infarcts are small and I doubt there Contributing significantly to the encephalopathy. The patient will be placed on aspirin 325. ECHO-P  3. Gait impairment of unclear etiology but likely multifactorial including marked osteoarthritis, neuropathy and aging. Orthostatics will be obtained. Also suggest physical therapy.   4. Episodic AMS and memory loss. Get EEG  No new complaints. Ambulated in halls w help today. No recurrent AMS. Care giver states pt has episodic confusion and memory loss at home. 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 Stays by herself.  May need skilled fascillity given these concerns.   LYING DOWN 140/92  HR 94  STANDING 3 MIN 174/99 HR 100  GENERAL: This a pleasant obese female in no acute distress.  HEENT: Supple. Atraumatic normocephalic. There is no scalp tenderness.  ABDOMEN: soft  EXTREMITIES: No edema. There is rather severe arthritic changes of knees and elbows and other joints. She does seem to have pain on palpation of the legs bilaterally.  BACK: Normal.  SKIN: Normal by inspection.  MENTAL STATUS: She is awake and alert. She has poor insight into her medical condition at this time. She is most recurrent to elicit however. No dysarthria is noted at this time. She follows commands briskly.  CRANIAL NERVES: Pupils are equal, round and reactive to light and accommodation; extra ocular movements are full, there is no significant nystagmus; visual fields are full; upper and lower facial muscles are normal in strength and symmetric, there  is no flattening of the nasolabial folds; tongue is midline; uvula is midline; shoulder elevation is normal.  MOTOR: Normal tone, bulk and strength; no pronator drift.  COORDINATION: Left finger to nose is normal, right finger to nose is normal, No rest tremor; no intention tremor; no postural tremor; no bradykinesia.  REFLEXES: Deep tendon reflexes are symmetrical and normal but diminished 1+ in the legs. Babinski reflexes are flexor bilaterally.  SENSATION: Normal to light touch.        Objective: Vital signs in last 24 hours: Temp:  [98 F (36.7 C)-98.8 F (37.1 C)] 98.2 F (36.8 C) (09/03 1359) Pulse Rate:  [86-90] 87 (09/03 1359) Resp:  [20] 20 (09/03 1359) BP: (149-155)/(73-82) 155/77 mmHg (09/03 1359) SpO2:  [97 %] 97 % (09/03 1359)  Intake/Output from previous day: 09/02 0701 - 09/03 0700 In: 3438.8 [P.O.:720; I.V.:2668.8; IV Piggyback:50] Out: 1017 [Urine:1450] Intake/Output this shift: Total I/O In: 3615.4 [P.O.:480; I.V.:3085.4; IV Piggyback:50] Out: 1300 [Urine:1300] Nutritional status: Carb Control   Lab Results: Results for orders placed during the hospital encounter of 09/27/13 (from the past 48 hour(s))  GLUCOSE, CAPILLARY     Status: Abnormal   Collection Time    09/27/13  9:27 PM      Result Value Ref Range   Glucose-Capillary 161 (*) 70 - 99 mg/dL   Comment 1 Notify RN     Comment 2 Documented in Chart    TROPONIN I     Status: None   Collection Time    09/27/13 10:15 PM  Result Value Ref Range   Troponin I <0.30  <0.30 ng/mL   Comment:            Due to the release kinetics of cTnI,     a negative result within the first hours     of the onset of symptoms does not rule out     myocardial infarction with certainty.     If myocardial infarction is still suspected,     repeat the test at appropriate intervals.  TROPONIN I     Status: None   Collection Time    09/28/13  4:22 AM      Result Value Ref Range   Troponin I <0.30  <0.30 ng/mL    Comment:            Due to the release kinetics of cTnI,     a negative result within the first hours     of the onset of symptoms does not rule out     myocardial infarction with certainty.     If myocardial infarction is still suspected,     repeat the test at appropriate intervals.  COMPREHENSIVE METABOLIC PANEL     Status: Abnormal   Collection Time    09/28/13  4:22 AM      Result Value Ref Range   Sodium 138  137 - 147 mEq/L   Potassium 4.0  3.7 - 5.3 mEq/L   Chloride 100  96 - 112 mEq/L   CO2 27  19 - 32 mEq/L   Glucose, Bld 118 (*) 70 - 99 mg/dL   BUN 16  6 - 23 mg/dL   Creatinine, Ser 1.00  0.50 - 1.10 mg/dL   Calcium 9.0  8.4 - 10.5 mg/dL   Total Protein 7.2  6.0 - 8.3 g/dL   Albumin 3.0 (*) 3.5 - 5.2 g/dL   AST 20  0 - 37 U/L   ALT 10  0 - 35 U/L   Alkaline Phosphatase 53  39 - 117 U/L   Total Bilirubin 0.3  0.3 - 1.2 mg/dL   GFR calc non Af Amer 56 (*) >90 mL/min   GFR calc Af Amer 65 (*) >90 mL/min   Comment: (NOTE)     The eGFR has been calculated using the CKD EPI equation.     This calculation has not been validated in all clinical situations.     eGFR's persistently <90 mL/min signify possible Chronic Kidney     Disease.   Anion gap 11  5 - 15  CBC     Status: Abnormal   Collection Time    09/28/13  4:22 AM      Result Value Ref Range   WBC 9.4  4.0 - 10.5 K/uL   RBC 3.91  3.87 - 5.11 MIL/uL   Hemoglobin 11.6 (*) 12.0 - 15.0 g/dL   HCT 34.5 (*) 36.0 - 46.0 %   MCV 88.2  78.0 - 100.0 fL   MCH 29.7  26.0 - 34.0 pg   MCHC 33.6  30.0 - 36.0 g/dL   RDW 14.2  11.5 - 15.5 %   Platelets 213  150 - 400 K/uL  GLUCOSE, CAPILLARY     Status: Abnormal   Collection Time    09/28/13  8:04 AM      Result Value Ref Range   Glucose-Capillary 133 (*) 70 - 99 mg/dL   Comment 1 Notify RN    GLUCOSE, CAPILLARY     Status: Abnormal  Collection Time    09/28/13 11:30 AM      Result Value Ref Range   Glucose-Capillary 182 (*) 70 - 99 mg/dL  GLUCOSE, CAPILLARY      Status: Abnormal   Collection Time    09/28/13  4:32 PM      Result Value Ref Range   Glucose-Capillary 177 (*) 70 - 99 mg/dL   Comment 1 Notify RN     Comment 2 Documented in Chart    VITAMIN B12     Status: None   Collection Time    09/28/13  9:05 PM      Result Value Ref Range   Vitamin B-12 644  211 - 911 pg/mL   Comment: Performed at [facility] [facility]     Status: None   Collection Time    09/28/13  9:05 PM      Result Value Ref Range   Homocysteine 14.9  4.0 - 15.4 umol/L   Comment: Performed at [facility]     Status: Abnormal   Collection Time    09/28/13  9:05 PM      Result Value Ref Range   CRP 0.5 (*) <0.60 mg/dL   Comment: Performed at [place], CAPILLARY     Status: Abnormal   Collection Time    09/28/13 10:13 PM      Result Value Ref Range   Glucose-Capillary 143 (*) 70 - 99 mg/dL  GLUCOSE, CAPILLARY     Status: Abnormal   Collection Time    09/29/13  8:03 AM      Result Value Ref Range   Glucose-Capillary 153 (*) 70 - 99 mg/dL   Comment 1 Notify RN    GLUCOSE, CAPILLARY     Status: Abnormal   Collection Time    09/29/13 12:46 PM      Result Value Ref Range   Glucose-Capillary 108 (*) 70 - 99 mg/dL   Comment 1 Notify RN      Lipid Panel No results found for this basename: CHOL, TRIG, HDL, CHOLHDL, VLDL, LDLCALC,  in the last 72 hours  Studies/Results:  CAROTID  RIGHT  ICA: 63/25 cm/sec  CCA: 85/88 cm/sec  SYSTOLIC ICA/CCA RATIO: 5.02  DIASTOLIC ICA/CCA RATIO: 7.74  ECA: 63 cm/sec  LEFT  ICA: 63/25 cm/sec  CCA: 12/87 cm/sec  SYSTOLIC ICA/CCA RATIO: 8.67  DIASTOLIC ICA/CCA RATIO: 6.72  ECA: 76 cm/sec  RIGHT CAROTID ARTERY: Mild atherosclerotic plaque is noted in the  carotid bulb. The waveforms, velocities and flow velocity ratios  show no evidence of focal hemodynamically significant stenosis.  RIGHT VERTEBRAL ARTERY: Antegrade in nature.  LEFT CAROTID ARTERY: Mild  atherosclerotic plaque is noted. The  waveforms, velocities and flow velocity ratios show no evidence of  focal hemodynamically significant stenosis.  LEFT VERTEBRAL ARTERY: Antegrade in nature.  IMPRESSION:  Mild plaque bilaterally without evidence of focal hemodynamically  significant stenosis.   BRAIN MRA FINDINGS:  Antegrade flow in the posterior circulation with mildly  dolichoectatic distal vertebral arteries. Normal right PICA origin,  dominant left AICA is normal. Patent and dolichoectatic  vertebrobasilar junction and basilar artery. Normal SCA and PCA  origins. Small posterior communicating arteries. Normal right PCA  branches.  The left PCA P1 and P2 segments are patent and appear normal. At the  left PCA bifurcation there is 5 mm segment of high-grade stenosis or  near occlusion affecting the superior division (series 2, image 105  and  series 111, image 6). There is preserved distal flow.  Antegrade flow in both ICA siphons which are mildly tortuous but not  enlarged like in the posterior circulation. No siphon stenosis  identified. Ophthalmic and posterior communicating artery origins  are within normal limits. Patent carotid termini. Normal MCA and ACA  origins. Anterior communicating artery and visualized bilateral ACA  branches are within normal limits. There is a small median artery of  the corpus callosum. Visualized bilateral MCA branches are within  normal limits.  IMPRESSION:  1. Short segment of high-grade stenosis or near occlusion of the  left PCA superior division (P3). Preserved distal flow.  2. Posterior circulation otherwise negative except for  vertebrobasilar dolichoectasia.  3. Negative anterior circulation.    Medications:  Scheduled Meds: . aspirin  325 mg Oral Daily  . calcium carbonate  1,250 mg Oral BID WC  . cefTRIAXone (ROCEPHIN)  IV  1 g Intravenous Q24H  . glipiZIDE  5 mg Oral BID AC   And  . metFORMIN  500 mg Oral BID AC  . heparin   5,000 Units Subcutaneous 3 times per day  . hydrochlorothiazide  25 mg Oral Daily  . insulin aspart  0-20 Units Subcutaneous TID WC  . insulin aspart  0-5 Units Subcutaneous QHS  . linagliptin  5 mg Oral Daily  . nortriptyline  25 mg Oral BID  . simvastatin  40 mg Oral QPM  . sodium chloride  3 mL Intravenous Q12H   Continuous Infusions: . sodium chloride 125 mL/hr at 09/29/13 0355   PRN Meds:.ondansetron (ZOFRAN) IV, ondansetron, ondansetron     LOS: 2 days   Averil Digman A. Merlene Laughter, M.D.  Diplomate, Tax adviser of Psychiatry and Neurology ( Neurology).

## 2013-09-29 NOTE — Plan of Care (Signed)
Problem: Phase II Progression Outcomes Goal: Progress activity as tolerated unless otherwise ordered Outcome: Progressing Out of bed to chair today.     

## 2013-09-29 NOTE — Progress Notes (Signed)
Subjective: The patient had an episode of nausea and vomiting through the night. She remains afebrile. She does have urinary tract infection. The urethral catheter was removed. She was seen in consultation by neurology who feels she has acute encephalopathy etiology from toxic metabolic causes such as acute urinary tract infection. Colony count was greater than 100,000. She also has diabetes mellitus type 2  Objective: Vital signs in last 24 hours: Temp:  [97.8 F (36.6 C)-98.8 F (37.1 C)] 98 F (36.7 C) (09/03 0524) Pulse Rate:  [90-105] 90 (09/02 2210) Resp:  [20] 20 (09/03 0524) BP: (115-151)/(62-82) 151/82 mmHg (09/02 2210) SpO2:  [97 %-98 %] 97 % (09/02 2210) Weight change:  Last BM Date: 09/27/13  Intake/Output from previous day: 09/02 0701 - 09/03 0700 In: 3438.8 [P.O.:720; I.V.:2668.8; IV Piggyback:50] Out: 1450 [Urine:1450] Intake/Output this shift: Total I/O In: -  Out: 800 [Urine:800]  Physical Exam: General appearance patient is a alert and oriented but uncomfortable  HEENT negative  Neck supple no JVD or thyroid abnormalities  Heart regular rhythm no murmurs  Lungs clear to P&A  Abdomen the palpable organs or masses   Recent Labs  09/27/13 1318 09/28/13 0422  WBC 11.8* 9.4  HGB 12.2 11.6*  HCT 35.9* 34.5*  PLT 248 213   BMET  Recent Labs  09/27/13 1318 09/28/13 0422  NA 133* 138  K 4.4 4.0  CL 94* 100  CO2 27 27  GLUCOSE 186* 118*  BUN 18 16  CREATININE 1.02 1.00  CALCIUM 9.3 9.0    Studies/Results: Ct Head Wo Contrast  09/27/2013   CLINICAL DATA:  Altered mental status.  EXAM: CT HEAD WITHOUT CONTRAST  TECHNIQUE: Contiguous axial images were obtained from the base of the skull through the vertex without intravenous contrast.  COMPARISON:  None.  FINDINGS: No intra-axial or extra-axial pathologic fluid of blood collection. Severe white matter changes noted consistent with chronic ischemia. 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 No acute bony  abnormality. Mild mucosal thickening of the paranasal sinuses  IMPRESSION: No acute abnormality.  Chronic ischemic change.   Electronically Signed   By: Maisie Fus  Register   On: 09/27/2013 14:57   Mr Brain Wo Contrast  09/27/2013   CLINICAL DATA:  73 year old female with weakness, altered mental status, dysphagia. Initial encounter.  EXAM: MRI HEAD WITHOUT CONTRAST  TECHNIQUE: Multiplanar, multiecho pulse sequences of the brain and surrounding structures were obtained without intravenous contrast.  COMPARISON:  Head CT without contrast 1449 hr today and earlier.  FINDINGS: Several small cortically based foci of restricted diffusion in the superior left occipital lobe (series 101, image 4) with perhaps mild involvement of subcortical white matter. No associated mass effect or hemorrhage.  No contralateral or posterior fossa restricted diffusion. Major intracranial vascular flow voids are preserved. There is a mild degree of intracranial artery dolichoectasia.  No midline shift, mass effect, evidence of mass lesion, ventriculomegaly, extra-axial collection or acute intracranial hemorrhage. Pituitary within normal limits. Degenerative ligamentous hypertrophy about the odontoid resulting in mild stenosis at the cervicomedullary junction. Patchy and confluent T2 and FLAIR hyperintensity in the cerebral white matter (greater on the left) in the bilateral thalami (series 6, image 12) and in the central pons. No superimposed cortical encephalomalacia identified. Cerebellum is within normal limits for age.  Visible internal auditory structures appear normal. Trace mastoid fluid. Mild paranasal sinus mucosal thickening, primarily in the left maxillary sinus. Postoperative changes to both globes. Visualized scalp soft tissues are within normal limits. Normal bone marrow signal.  IMPRESSION: 1. 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 No mass effect or hemorrhage. 2. Moderately advanced but nonspecific signal  changes in the cerebral white matter (greater in the left hemisphere), thalami and pons. 3. Mild intracranial artery dolichoectasia. 4. Mild degenerative cervicomedullary junction stenosis.   Electronically Signed   By: Augusto Gamble M.D.   On: 09/27/2013 17:14   Dg Chest Portable 1 View  09/27/2013   CLINICAL DATA:  Altered mental status.  EXAM: PORTABLE CHEST - 1 VIEW  COMPARISON:  PA and lateral chest 06/21/2012 and 08/02/2010.  FINDINGS: There is cardiomegaly without edema. Subsegmental atelectasis is seen in the left lung base. The right lung is clear. No pneumothorax is identified.  IMPRESSION: Cardiomegaly without acute disease.   Electronically Signed   By: Drusilla Kanner M.D.   On: 09/27/2013 13:00    Medications:  . aspirin  325 mg Oral Daily  . calcium carbonate  1,250 mg Oral BID WC  . cefTRIAXone (ROCEPHIN)  IV  1 g Intravenous Q24H  . glipiZIDE  5 mg Oral BID AC   And  . metFORMIN  500 mg Oral BID AC  . heparin  5,000 Units Subcutaneous 3 times per day  . insulin aspart  0-20 Units Subcutaneous TID WC  . insulin aspart  0-5 Units Subcutaneous QHS  . linagliptin  5 mg Oral Daily  . nortriptyline  25 mg Oral BID  . simvastatin  40 mg Oral QPM  . sodium chloride  3 mL Intravenous Q12H    . sodium chloride 125 mL/hr at 09/29/13 0355     Assessment/Plan: 1. Altered mental status-acute encephalopathy-small infarcts in occipital lobe  2. Diabetes mellitus type 2-plan to continue metformin and insulin coverage  3. Urinary tract infection with colony count greater than 100,000 plan to continue current IV Rocephin   LOS: 2 days   Odilon Cass G 09/29/2013, 6:06 AM

## 2013-09-30 ENCOUNTER — Inpatient Hospital Stay (HOSPITAL_COMMUNITY): Admit: 2013-09-30 | Discharge: 2013-09-30 | Disposition: A | Discharge status: Home / Self Care | Payer: Medicare Other

## 2013-09-30 DIAGNOSIS — I059 Rheumatic mitral valve disease, unspecified: Secondary | ICD-10-CM

## 2013-09-30 LAB — GLUCOSE, CAPILLARY
GLUCOSE-CAPILLARY: 113 mg/dL — AB (ref 70–99)
Glucose-Capillary: 180 mg/dL — ABNORMAL HIGH (ref 70–99)
Glucose-Capillary: 95 mg/dL (ref 70–99)
Glucose-Capillary: 118 mg/dL — ABNORMAL HIGH (ref 70–99)

## 2013-09-30 NOTE — Progress Notes (Signed)
Subjective: The patient had a fairly comfortable night. She remains afebrile she's had no further nausea or vomiting. She does have urinary tract infection secondary to Escherichia coli. She also has diabetes mellitus type 2.  Objective: Vital signs in last 24 hours: Temp:  [97.9 F (36.6 C)-98.2 F (36.8 C)] 97.9 F (36.6 C) (09/04 0610) Pulse Rate:  [86-99] 99 (09/04 0610) Resp:  [20] 20 (09/04 0610) BP: (139-166)/(73-88) 139/88 mmHg (09/04 0610) SpO2:  [96 %-97 %] 96 % (09/04 0610) Weight change:  Last BM Date: 09/27/13  Intake/Output from previous day: 09/03 0701 - 09/04 0700 In: 3615.4 [P.O.:480; I.V.:3085.4; IV Piggyback:50] Out: 1300 [Urine:1300] Intake/Output this shift:    Physical Exam: General appearance-the patient is alert and oriented  HEENT negative  Neck supple no JVD or thyroid abnormalities  Heart regular rhythm no murmurs  Lungs clear to P&A  Abdomen the palpable organs or masses  Neurological patient has no focal deficit   Recent Labs  09/27/13 1318 09/28/13 0422  WBC 11.8* 9.4  HGB 12.2 11.6*  HCT 35.9* 34.5*  PLT 248 213   BMET  Recent Labs  09/27/13 1318 09/28/13 0422  NA 133* 138  K 4.4 4.0  CL 94* 100  CO2 27 27  GLUCOSE 186* 118*  BUN 18 16  CREATININE 1.02 1.00  CALCIUM 9.3 9.0    Studies/Results: Mr Shirlee Latch Wo Contrast  09/29/2013   CLINICAL DATA:  73 year old female with weakness and altered mental status found to have small acute left PCA infarcts. Initial encounter.  EXAM: MRA HEAD WITHOUT CONTRAST  TECHNIQUE: Angiographic images of the Circle of Willis were obtained using MRA technique without intravenous contrast.  COMPARISON:  Brain MRI 09/27/2013 and earlier.  FINDINGS: Antegrade flow in the posterior circulation with mildly dolichoectatic distal vertebral arteries. Normal right PICA origin, dominant left AICA is normal. Patent and dolichoectatic vertebrobasilar junction and basilar artery. Normal SCA and PCA  origins. Small posterior communicating arteries. Normal right PCA branches.  The left PCA P1 and P2 segments are patent and appear normal. At the left PCA bifurcation there is 5 mm segment of high-grade stenosis or near occlusion affecting the superior division (series 2, image 105 and series 111, image 6). There is preserved distal flow.  Antegrade flow in both ICA siphons which are mildly tortuous but not enlarged like in the posterior circulation. No siphon stenosis identified. Ophthalmic and posterior communicating artery origins are within normal limits. Patent carotid termini. Normal MCA and ACA origins. Anterior communicating artery and visualized bilateral ACA branches are within normal limits. There is a small median artery of the corpus callosum. Visualized bilateral MCA branches are within normal limits.  IMPRESSION: 1. Short segment of high-grade stenosis or near occlusion of the left PCA superior division (P3). Preserved distal flow. 2. Posterior circulation otherwise negative except for vertebrobasilar dolichoectasia. 3. Negative anterior circulation.   Electronically Signed   By: Augusto Gamble M.D.   On: 09/29/2013 11:36   US Carotid Bilateral  09/29/2013   CLINICAL DATA:  New stroke  EXAM: BILATERAL CAROTID DUPLEX ULTRASOUND  TECHNIQUE: Wallace Cullens scale imaging, color Doppler and duplex ultrasound were performed of bilateral carotid and vertebral arteries in the neck.  COMPARISON:  None.  FINDINGS: Criteria: Quantification of carotid stenosis is based on velocity parameters that correlate the residual internal carotid diameter with NASCET-based stenosis levels, using the diameter of the distal internal carotid lumen as the denominator for stenosis measurement.  The following velocity measurements were obtained:  RIGHT  ICA:  63/25 cm/sec  CCA:  89/23 cm/sec  SYSTOLIC ICA/CCA RATIO:  0.73  DIASTOLIC ICA/CCA RATIO:  1.02  ECA:  63 cm/sec  LEFT  ICA:  63/25 cm/sec  CCA:  70/16 cm/sec  SYSTOLIC ICA/CCA RATIO:   0.91  DIASTOLIC ICA/CCA RATIO:  1.56  ECA:  76 cm/sec  RIGHT CAROTID ARTERY: Mild atherosclerotic plaque is noted in the carotid bulb. The waveforms, velocities and flow velocity ratios show no evidence of focal hemodynamically significant stenosis.  RIGHT VERTEBRAL ARTERY:  Antegrade in nature.  LEFT CAROTID ARTERY: Mild atherosclerotic plaque is noted. The waveforms, velocities and flow velocity ratios show no evidence of focal hemodynamically significant stenosis.  LEFT VERTEBRAL ARTERY:  Antegrade in nature.  IMPRESSION: Mild plaque bilaterally without evidence of focal hemodynamically significant stenosis.   Electronically Signed   By: Alcide Clever M.D.   On: 09/29/2013 16:42    Medications:  . aspirin  325 mg Oral Daily  . calcium carbonate  1,250 mg Oral BID WC  . cefTRIAXone (ROCEPHIN)  IV  1 g Intravenous Q24H  . glipiZIDE  5 mg Oral BID AC   And  . metFORMIN  500 mg Oral BID AC  . heparin  5,000 Units Subcutaneous 3 times per day  . hydrochlorothiazide  25 mg Oral Daily  . insulin aspart  0-20 Units Subcutaneous TID WC  . insulin aspart  0-5 Units Subcutaneous QHS  . linagliptin  5 mg Oral Daily  . nortriptyline  25 mg Oral BID  . simvastatin  40 mg Oral QPM  . sodium chloride  3 mL Intravenous Q12H    . sodium chloride 20 mL/hr at 09/29/13 1905     Assessment/Plan: 1. Altered mental status-acute encephalopathy-small infarctions occipital lobe  2. Diabetes mellitus type 2-plan to continue metformin and insulin coverage  3. Urinary tract infection secondary to Escherichia coli-plan to continue current IV Rocephin could be transitioned to oral medication possibly nitrofurantoin   LOS: 3 days   Danuta Huseman G 09/30/2013, 6:25 AM

## 2013-09-30 NOTE — Progress Notes (Signed)
  Echocardiogram 2D Echocardiogram has been performed.  [name] 09/30/2013, 4:03 PM

## 2013-09-30 NOTE — Progress Notes (Signed)
Physical Therapy Treatment Patient Details Name: [name] MRN: [id number] DOB: Apr 19, 1940 Today's Date: October 14, 2013    History of Present Illness      PT Comments    Pt very cooperative and did not exhibit any significant confusion.  She will need to continue to use a walker for all gait in order to achieve maximal stability as well as to protect her hips (has hip pain with weight bearing).  Heart rate increased to 130 and O2 sat=90% during gait.  Follow Up Recommendations  Home health PT;Other (comment) (pt reports she has a CG during the day and family will try to extend this to all day)     Equipment Recommendations  None recommended by PT    Recommendations for Other Services       Precautions / Restrictions Precautions Precautions: Fall Restrictions Weight Bearing Restrictions: No    Mobility  Bed Mobility                  Transfers Overall transfer level: Modified independent Equipment used: Rolling walker (2 wheeled)   Sit to Stand: Supervision Stand pivot transfers: Supervision       General transfer comment: Pt needs cues to pause upon standing to gain full stabiolity prior to walking  Ambulation/Gait Ambulation/Gait assistance: Supervision Ambulation Distance (Feet): 150 Feet Assistive device: Rolling walker (2 wheeled) Gait Pattern/deviations: Decreased step length - left;Decreased step length - right;Trunk flexed Gait velocity: gait velocity decreased due to bilateral hip pain during gait   General Gait Details: pt must continue to use a walker for all gait due to mild instability as well as hip pain with gait   Stairs            Wheelchair Mobility    Modified Rankin (Stroke Patients Only)       Balance Overall balance assessment: Needs assistance           Standing balance-Leahy Scale: Fair                      Cognition Arousal/Alertness: Awake/alert Behavior During Therapy: WFL for tasks  assessed/performed                        Exercises General Exercises - Lower Extremity Ankle Circles/Pumps: AROM;Both;10 reps;Seated Long Arc Quad: AROM;Both;10 reps;Seated Hip ABduction/ADduction: Both;10 reps;Strengthening    General Comments        Pertinent Vitals/Pain Pain Assessment: No/denies pain    Home Living                      Prior Function            PT Goals (current goals can now be found in the care plan section) Progress towards PT goals: Progressing toward goals    Frequency       PT Plan Current plan remains appropriate    Co-evaluation             End of Session Equipment Utilized During Treatment: Gait belt Activity Tolerance: Patient tolerated treatment well Patient left: in chair;with call bell/phone within reach;with chair alarm set     Time: 0932-1010 PT Time Calculation (min): 38 min  Charges:  $Gait Training: 8-22 mins $Therapeutic Exercise: 8-22 mins                    G Codes:      Konrad Penta Oct 14, 2013, 10:18 AM

## 2013-09-30 NOTE — Evaluation (Addendum)
Clinical/Bedside Swallow Evaluation Patient Details  Name: [name] MRN: [id number] Date of Birth: 1940-06-27  Today's Date: 09/30/2013 Time: 4098-1191 SLP Time Calculation (min): 25 min  Past Medical History:  Past Medical History  Diagnosis Date  . Diabetes mellitus without complication   . Hypertension   . Hypercholesterolemia    Past Surgical History:  Past Surgical History  Procedure Laterality Date  . Abdominal hysterectomy    . Fracture surgery     HPI:  Pt is a 73 y.o. female with diabetes and HTN, admitted 9/1 for confusion/ UTI. MRI 9/1 revealed several small acute infarcts in L occipital lobe, no mass effect. CXR 9/1 revealed subsegmental atelectasis in LLL. Bedside swallow eval ordered to assess swallow function d/t CXR findings and AMS.   Assessment / Plan / Recommendation Clinical Impression  Pt demonstrated no overt s/s of aspiration at bedside; consumed about 6 oz of water with no difficulties. Pt refused solid food d/t dentures being unavailable; however reported that she has had no difficulties while at hospital, and RN reported no s/s of aspiration. Pt reported occasionally getting choked on solids while at home but no difficulties with liquids. Given report of difficulties with solids and dentures being unavailable, pt may be at risk of aspiration on a regular texture diet. Recommend downgrading diet to dysphagia 3/ thin liquids, meds whole with liquid. ST will follow up x1 if pt is still in hospital to ensure diet tolerance.    Aspiration Risk  Mild    Diet Recommendation Dysphagia 3 (Mechanical Soft);Thin liquid   Liquid Administration via: Cup;Straw Medication Administration: Whole meds with liquid Supervision: Patient able to self feed Compensations: Slow rate;Small sips/bites Postural Changes and/or Swallow Maneuvers: Seated upright 90 degrees;Upright 30-60 min after meal    Other  Recommendations Oral Care Recommendations: Oral care BID    Follow Up Recommendations  None    Frequency and Duration min 1 x/week  1 week   Pertinent Vitals/Pain n/a    SLP Swallow Goals     Swallow Study Prior Functional Status       General HPI: Pt is a 73 y.o. female with diabetes and HTN, admitted 9/1 for confusion/ UTI. MRI 9/1 revealed several small acute infarcts in L occipital lobe, no mass effect. CXR 9/1 revealed subsegmental atelectasis in LLL. Bedside swallow eval ordered to assess swallow function d/t CXR results. Type of Study: Bedside swallow evaluation Diet Prior to this Study: Regular;Thin liquids Temperature Spikes Noted: No Respiratory Status: Room air History of Recent Intubation: No Behavior/Cognition: Alert;Cooperative;Pleasant mood Oral Cavity - Dentition: Missing dentition;Dentures, not available Self-Feeding Abilities: Able to feed self Patient Positioning: Upright in bed Baseline Vocal Quality: Clear Volitional Cough: Strong Volitional Swallow: Able to elicit    Oral/Motor/Sensory Function Overall Oral Motor/Sensory Function: Appears within functional limits for tasks assessed Labial ROM: Within Functional Limits Labial Symmetry: Within Functional Limits Labial Strength: Within Functional Limits Labial Sensation: Within Functional Limits Lingual ROM: Within Functional Limits Lingual Symmetry: Within Functional Limits Lingual Strength: Within Functional Limits Lingual Sensation: Within Functional Limits   Ice Chips Ice chips: Not tested   Thin Liquid Thin Liquid: Within functional limits Presentation: Cup;Straw    Nectar Thick Nectar Thick Liquid: Not tested   Honey Thick Honey Thick Liquid: Not tested   Puree Puree: Within functional limits Presentation: Self Fed;Spoon   Solid   GO    Solid: Not tested       Metro Kung, MA, CCC-SLP 09/30/2013,5:03 PM

## 2013-09-30 NOTE — Progress Notes (Signed)
Off-site adult EEG completed at [facility].  Results pending.

## 2013-10-01 LAB — GLUCOSE, CAPILLARY
Glucose-Capillary: 166 mg/dL — ABNORMAL HIGH (ref 70–99)
Glucose-Capillary: 162 mg/dL — ABNORMAL HIGH (ref 70–99)

## 2013-10-01 NOTE — Discharge Summary (Signed)
Physician Discharge Summary  [name]:[id number] DOB: Dec 04, 1940 DOA: 09/27/2013  PCP: Alice Reichert, MD  Admit date: 09/27/2013 Discharge date: 10/01/2013   Follow-up Information   Follow up with [facility].   Contact information:   [address] [place] [address] [phone]       Discharge Diagnoses:  1. Urinary tract infection due to Escherichia coli 2. Altered mental status encephalopathy secondary to urinary tract infection 3. Diabetes mellitus2 4. 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   Discharge Condition: Stable Disposition: Home  Diet recommendation: [address] ADA diet  Filed Weights   09/27/13 1741  Weight: 82.1 kg (181 lb)    History of present illness:  The patient was admitted through the emergency department after having presented there with mental confusion. She did have low-grade fever and was felt she had urinary tract infection she was admitted for further management.  Hospital Course:  The patient had urine cultured and eventually grew out Escherichia coli. Patient was placed on IV fluids and IV Rocephin she was continued on medications listed below. She was seen by physical therapist and was seen as well by neurology. Neurologist felt that the mental confusion was secondary to urinary tract infection and encephalopathy secondary to this. She did have small cortical infarcts noted on MRI. CT of the head showed no acute changes. Chest x-ray cardiomegaly without acute disease. The patient's symptoms gradually improved and she was gradually ambulated.   Discharge Instructions The patient is to continue medications listed below. Her sugars should be monitored and medications continued. Appointment will be given for this to primary care physician's office    Medication List         calcium carbonate 600 MG Tabs tablet  Commonly known as:  OS-CAL  Take 600 mg by mouth 2 (two) times daily with a meal.      glipiZIDE-metformin 5-500 MG per tablet  Commonly known as:  METAGLIP  Take 1 tablet by mouth 2 (two) times daily before a meal.     hydrochlorothiazide 25 MG tablet  Commonly known as:  HYDRODIURIL  Take 25 mg by mouth daily as needed (blood pressure).     HYDROcodone-acetaminophen 5-500 MG per tablet  Commonly known as:  VICODIN  Take 1 tablet by mouth every 4 (four) hours as needed for pain.     linagliptin 5 MG Tabs tablet  Commonly known as:  TRADJENTA  Take 5 mg by mouth daily.     LORazepam 0.5 MG tablet  Commonly known as:  ATIVAN  Take 0.5 mg by mouth every 4 (four) hours as needed for anxiety.     nortriptyline 25 MG capsule  Commonly known as:  PAMELOR  Take 25 mg by mouth 2 (two) times daily.     ondansetron 4 MG tablet  Commonly known as:  ZOFRAN  Take 4 mg by mouth every 6 (six) hours as needed for nausea.     simvastatin 40 MG tablet  Commonly known as:  ZOCOR  Take 40 mg by mouth every evening.       Allergies  Allergen Reactions  . Penicillins Rash    The results of significant diagnostics from this hospitalization (including imaging, microbiology, ancillary and laboratory) are listed below for reference.    Significant Diagnostic Studies: Ct Head Wo Contrast  09/27/2013   CLINICAL DATA:  Altered mental status.  EXAM: CT HEAD WITHOUT CONTRAST  TECHNIQUE: Contiguous axial images were obtained from the base  of the skull through the vertex without intravenous contrast.  COMPARISON:  None.  FINDINGS: No intra-axial or extra-axial pathologic fluid of blood collection. Severe white matter changes noted consistent with chronic ischemia. 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 No acute bony abnormality. Mild mucosal thickening of the paranasal sinuses  IMPRESSION: No acute abnormality.  Chronic ischemic change.   Electronically Signed   By: Maisie Fus  Register   On: 09/27/2013 14:57   Mr Maxine Glenn Head Wo Contrast  09/29/2013   CLINICAL DATA:  73 year old female with weakness  and altered mental status found to have small acute left PCA infarcts. Initial encounter.  EXAM: MRA HEAD WITHOUT CONTRAST  TECHNIQUE: Angiographic images of the Circle of Willis were obtained using MRA technique without intravenous contrast.  COMPARISON:  Brain MRI 09/27/2013 and earlier.  FINDINGS: Antegrade flow in the posterior circulation with mildly dolichoectatic distal vertebral arteries. Normal right PICA origin, dominant left AICA is normal. Patent and dolichoectatic vertebrobasilar junction and basilar artery. Normal SCA and PCA origins. Small posterior communicating arteries. Normal right PCA branches.  The left PCA P1 and P2 segments are patent and appear normal. At the left PCA bifurcation there is 5 mm segment of high-grade stenosis or near occlusion affecting the superior division (series 2, image 105 and series 111, image 6). There is preserved distal flow.  Antegrade flow in both ICA siphons which are mildly tortuous but not enlarged like in the posterior circulation. No siphon stenosis identified. Ophthalmic and posterior communicating artery origins are within normal limits. Patent carotid termini. Normal MCA and ACA origins. Anterior communicating artery and visualized bilateral ACA branches are within normal limits. There is a small median artery of the corpus callosum. Visualized bilateral MCA branches are within normal limits.  IMPRESSION: 1. Short segment of high-grade stenosis or near occlusion of the left PCA superior division (P3). Preserved distal flow. 2. Posterior circulation otherwise negative except for vertebrobasilar dolichoectasia. 3. Negative anterior circulation.   Electronically Signed   By: Augusto Gamble M.D.   On: 09/29/2013 11:36   Mr Brain Wo Contrast  09/27/2013   CLINICAL DATA:  73 year old female with weakness, altered mental status, dysphagia. Initial encounter.  EXAM: MRI HEAD WITHOUT CONTRAST  TECHNIQUE: Multiplanar, multiecho pulse sequences of the brain and  surrounding structures were obtained without intravenous contrast.  COMPARISON:  Head CT without contrast 1449 hr today and earlier.  FINDINGS: Several small cortically based foci of restricted diffusion in the superior left occipital lobe (series 101, image 4) with perhaps mild involvement of subcortical white matter. No associated mass effect or hemorrhage.  No contralateral or posterior fossa restricted diffusion. Major intracranial vascular flow voids are preserved. There is a mild degree of intracranial artery dolichoectasia.  No midline shift, mass effect, evidence of mass lesion, ventriculomegaly, extra-axial collection or acute intracranial hemorrhage. Pituitary within normal limits. Degenerative ligamentous hypertrophy about the odontoid resulting in mild stenosis at the cervicomedullary junction. Patchy and confluent T2 and FLAIR hyperintensity in the cerebral white matter (greater on the left) in the bilateral thalami (series 6, image 12) and in the central pons. No superimposed cortical encephalomalacia identified. Cerebellum is within normal limits for age.  Visible internal auditory structures appear normal. Trace mastoid fluid. Mild paranasal sinus mucosal thickening, primarily in the left maxillary sinus. Postoperative changes to both globes. Visualized scalp soft tissues are within normal limits. Normal bone marrow signal.  IMPRESSION: 1. 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 No mass effect or hemorrhage. 2. Moderately advanced  but nonspecific signal changes in the cerebral white matter (greater in the left hemisphere), thalami and pons. 3. Mild intracranial artery dolichoectasia. 4. Mild degenerative cervicomedullary junction stenosis.   Electronically Signed   By: Augusto Gamble M.D.   On: 09/27/2013 17:14   US Carotid Bilateral  09/29/2013   CLINICAL DATA:  New stroke  EXAM: BILATERAL CAROTID DUPLEX ULTRASOUND  TECHNIQUE: Wallace Cullens scale imaging, color Doppler and duplex ultrasound  were performed of bilateral carotid and vertebral arteries in the neck.  COMPARISON:  None.  FINDINGS: Criteria: Quantification of carotid stenosis is based on velocity parameters that correlate the residual internal carotid diameter with NASCET-based stenosis levels, using the diameter of the distal internal carotid lumen as the denominator for stenosis measurement.  The following velocity measurements were obtained:  RIGHT  ICA:  63/25 cm/sec  CCA:  89/23 cm/sec  SYSTOLIC ICA/CCA RATIO:  0.73  DIASTOLIC ICA/CCA RATIO:  1.02  ECA:  63 cm/sec  LEFT  ICA:  63/25 cm/sec  CCA:  70/16 cm/sec  SYSTOLIC ICA/CCA RATIO:  0.91  DIASTOLIC ICA/CCA RATIO:  1.56  ECA:  76 cm/sec  RIGHT CAROTID ARTERY: Mild atherosclerotic plaque is noted in the carotid bulb. The waveforms, velocities and flow velocity ratios show no evidence of focal hemodynamically significant stenosis.  RIGHT VERTEBRAL ARTERY:  Antegrade in nature.  LEFT CAROTID ARTERY: Mild atherosclerotic plaque is noted. The waveforms, velocities and flow velocity ratios show no evidence of focal hemodynamically significant stenosis.  LEFT VERTEBRAL ARTERY:  Antegrade in nature.  IMPRESSION: Mild plaque bilaterally without evidence of focal hemodynamically significant stenosis.   Electronically Signed   By: Alcide Clever M.D.   On: 09/29/2013 16:42   Dg Chest Portable 1 View  09/27/2013   CLINICAL DATA:  Altered mental status.  EXAM: PORTABLE CHEST - 1 VIEW  COMPARISON:  PA and lateral chest 06/21/2012 and 08/02/2010.  FINDINGS: There is cardiomegaly without edema. Subsegmental atelectasis is seen in the left lung base. The right lung is clear. No pneumothorax is identified.  IMPRESSION: Cardiomegaly without acute disease.   Electronically Signed   By: Drusilla Kanner M.D.   On: 09/27/2013 13:00    Microbiology: Recent Results (from the past 240 hour(s))  URINE CULTURE     Status: None   Collection Time    09/27/13  1:00 PM      Result Value Ref Range Status    Specimen Description URINE, CATHETERIZED   Final   Special Requests NONE   Final   Culture  Setup Time     Final   Value: 09/27/2013 18:16     Performed at [facility]   Colony Count     Final   Value: >=100,000 COLONIES/ML     Performed at [facility]   Culture     Final   Value: ESCHERICHIA COLI     Performed at [facility]   Report Status 09/29/2013 FINAL   Final   Organism ID, Bacteria ESCHERICHIA COLI   Final     Labs: Basic Metabolic Panel:  Recent Labs Lab 09/27/13 1318 09/28/13 0422  NA 133* 138  K 4.4 4.0  CL 94* 100  CO2 27 27  GLUCOSE 186* 118*  BUN 18 16  CREATININE 1.02 1.00  CALCIUM 9.3 9.0   Liver Function Tests:  Recent Labs Lab 09/27/13 1318 09/28/13 0422  AST 22 20  ALT 13 10  ALKPHOS 66 53  BILITOT 0.3 0.3  PROT 8.4* 7.2  ALBUMIN 3.6 3.0*   No results found for this basename: LIPASE, AMYLASE,  in the last 168 hours No results found for this basename: AMMONIA,  in the last 168 hours CBC:  Recent Labs Lab 09/27/13 1318 09/28/13 0422  WBC 11.8* 9.4  NEUTROABS 9.5*  --   HGB 12.2 11.6*  HCT 35.9* 34.5*  MCV 87.1 88.2  PLT 248 213   Cardiac Enzymes:  Recent Labs Lab 09/27/13 1318 09/27/13 1631 09/27/13 2215 09/28/13 0422  TROPONINI <0.30 <0.30 <0.30 <0.30   BNP: BNP (last 3 results)  Recent Labs  09/27/13 1318  PROBNP 794.0*   CBG:  Recent Labs Lab 09/29/13 2223 09/30/13 0738 09/30/13 1116 09/30/13 1635 09/30/13 2106  GLUCAP 79 113* 180* 95 118*    Active Problems:   UTI (urinary tract infection)   HTN (hypertension)   Diabetes   Dysphasia   Altered mental status   Time coordinating discharge: 30 minutes  Signed:  Butch Penny, MD 10/01/2013, 7:10 AM

## 2013-10-01 NOTE — Progress Notes (Signed)
Discharge instructions given, verbalized understanding, out in stable condition via w/c with staff. 

## 2013-10-03 NOTE — Procedures (Signed)
  [facility] Chevella Pearce A. Gerilyn Pilgrim, MD     [email or web address]           HISTORY: This is a 73- -year-old female who presents with AMS suspicious  for sz.  MEDICATIONS: Scheduled Meds: Continuous Infusions: PRN Meds:.    Prior to Admission medications   Medication Sig Start Date End Date Taking? Authorizing Provider  calcium carbonate (OS-CAL) 600 MG TABS Take 600 mg by mouth 2 (two) times daily with a meal.    Historical Provider, MD  glipiZIDE-metformin (METAGLIP) 5-500 MG per tablet Take 1 tablet by mouth 2 (two) times daily before a meal.    Historical Provider, MD  hydrochlorothiazide (HYDRODIURIL) 25 MG tablet Take 25 mg by mouth daily as needed (blood pressure).    Historical Provider, MD  HYDROcodone-acetaminophen (VICODIN) 5-500 MG per tablet Take 1 tablet by mouth every 4 (four) hours as needed for pain.    Historical Provider, MD  linagliptin (TRADJENTA) 5 MG TABS tablet Take 5 mg by mouth daily.    Historical Provider, MD  LORazepam (ATIVAN) 0.5 MG tablet Take 0.5 mg by mouth every 4 (four) hours as needed for anxiety.    Historical Provider, MD  nortriptyline (PAMELOR) 25 MG capsule Take 25 mg by mouth 2 (two) times daily.    Historical Provider, MD  ondansetron (ZOFRAN) 4 MG tablet Take 4 mg by mouth every 6 (six) hours as needed for nausea.    Historical Provider, MD  simvastatin (ZOCOR) 40 MG tablet Take 40 mg by mouth every evening.    Historical Provider, MD      ANALYSIS: A 16 channel recording using standard 10 20 measurements is conducted for about 20 minutes. There is a well formed posterior dominant rhythm of 8 1/2  Hz. Awake and sleep activities are seen.   Photic stimulation and hyperventilation are  not carried out. There are no focal or lateral slowing and no epileptiform activity.    IMPRESSION: 1. Normal recording of the awake and sleep states.       Thanh Mottern A. Gerilyn Pilgrim, M.D.  Diplomate, Biomedical engineer of Psychiatry and Neurology (  Neurology).

## 2014-04-10 ENCOUNTER — Encounter (INDEPENDENT_AMBULATORY_CARE_PROVIDER_SITE_OTHER): Payer: Medicare Other | Admitting: Ophthalmology

## 2014-05-11 ENCOUNTER — Encounter (INDEPENDENT_AMBULATORY_CARE_PROVIDER_SITE_OTHER): Payer: Medicare Other | Admitting: Ophthalmology

## 2014-05-29 ENCOUNTER — Encounter (INDEPENDENT_AMBULATORY_CARE_PROVIDER_SITE_OTHER): Payer: Medicare Other | Admitting: Ophthalmology

## 2014-05-29 DIAGNOSIS — I10 Essential (primary) hypertension: Secondary | ICD-10-CM

## 2014-05-29 DIAGNOSIS — H43813 Vitreous degeneration, bilateral: Secondary | ICD-10-CM | POA: Diagnosis not present

## 2014-05-29 DIAGNOSIS — E10311 Type 1 diabetes mellitus with unspecified diabetic retinopathy with macular edema: Secondary | ICD-10-CM

## 2014-05-29 DIAGNOSIS — E10321 Type 1 diabetes mellitus with mild nonproliferative diabetic retinopathy with macular edema: Secondary | ICD-10-CM | POA: Diagnosis not present

## 2014-05-29 DIAGNOSIS — H35033 Hypertensive retinopathy, bilateral: Secondary | ICD-10-CM | POA: Diagnosis not present

## 2014-05-29 DIAGNOSIS — E10329 Type 1 diabetes mellitus with mild nonproliferative diabetic retinopathy without macular edema: Secondary | ICD-10-CM

## 2015-07-04 ENCOUNTER — Telehealth: Payer: Self-pay | Admitting: Gastroenterology

## 2015-07-04 NOTE — Telephone Encounter (Signed)
Letter mailed to pt.  

## 2015-07-04 NOTE — Telephone Encounter (Signed)
Pt is on the July recall for a 10 yr colonoscopy °

## 2015-12-19 ENCOUNTER — Ambulatory Visit (INDEPENDENT_AMBULATORY_CARE_PROVIDER_SITE_OTHER): Payer: Medicare Other | Admitting: Urology

## 2015-12-19 DIAGNOSIS — N3941 Urge incontinence: Secondary | ICD-10-CM | POA: Diagnosis not present

## 2015-12-19 DIAGNOSIS — R351 Nocturia: Secondary | ICD-10-CM | POA: Diagnosis not present

## 2016-01-30 ENCOUNTER — Ambulatory Visit: Payer: Medicare Other | Admitting: Urology

## 2016-02-29 ENCOUNTER — Inpatient Hospital Stay (HOSPITAL_COMMUNITY)
Admission: EM | Admit: 2016-02-29 | Discharge: 2016-03-04 | DRG: 683 | Disposition: A | Discharge status: Home / Self Care | Payer: Medicare Other | Attending: Internal Medicine | Admitting: Internal Medicine

## 2016-02-29 ENCOUNTER — Encounter (HOSPITAL_COMMUNITY): Payer: Self-pay | Admitting: Emergency Medicine

## 2016-02-29 DIAGNOSIS — R197 Diarrhea, unspecified: Secondary | ICD-10-CM | POA: Diagnosis present

## 2016-02-29 DIAGNOSIS — B9620 Unspecified Escherichia coli [E. coli] as the cause of diseases classified elsewhere: Secondary | ICD-10-CM | POA: Diagnosis present

## 2016-02-29 DIAGNOSIS — N390 Urinary tract infection, site not specified: Secondary | ICD-10-CM | POA: Diagnosis present

## 2016-02-29 DIAGNOSIS — Z7984 Long term (current) use of oral hypoglycemic drugs: Secondary | ICD-10-CM

## 2016-02-29 DIAGNOSIS — R05 Cough: Secondary | ICD-10-CM | POA: Diagnosis present

## 2016-02-29 DIAGNOSIS — R4189 Other symptoms and signs involving cognitive functions and awareness: Secondary | ICD-10-CM | POA: Diagnosis present

## 2016-02-29 DIAGNOSIS — R69 Illness, unspecified: Secondary | ICD-10-CM

## 2016-02-29 DIAGNOSIS — E871 Hypo-osmolality and hyponatremia: Secondary | ICD-10-CM

## 2016-02-29 DIAGNOSIS — F17210 Nicotine dependence, cigarettes, uncomplicated: Secondary | ICD-10-CM | POA: Diagnosis present

## 2016-02-29 DIAGNOSIS — Z90710 Acquired absence of both cervix and uterus: Secondary | ICD-10-CM

## 2016-02-29 DIAGNOSIS — M6281 Muscle weakness (generalized): Secondary | ICD-10-CM

## 2016-02-29 DIAGNOSIS — K210 Gastro-esophageal reflux disease with esophagitis: Secondary | ICD-10-CM | POA: Diagnosis present

## 2016-02-29 DIAGNOSIS — K439 Ventral hernia without obstruction or gangrene: Secondary | ICD-10-CM | POA: Diagnosis present

## 2016-02-29 DIAGNOSIS — W010XXA Fall on same level from slipping, tripping and stumbling without subsequent striking against object, initial encounter: Secondary | ICD-10-CM | POA: Diagnosis present

## 2016-02-29 DIAGNOSIS — K319 Disease of stomach and duodenum, unspecified: Secondary | ICD-10-CM | POA: Diagnosis present

## 2016-02-29 DIAGNOSIS — E7800 Pure hypercholesterolemia, unspecified: Secondary | ICD-10-CM | POA: Diagnosis present

## 2016-02-29 DIAGNOSIS — I959 Hypotension, unspecified: Secondary | ICD-10-CM | POA: Diagnosis not present

## 2016-02-29 DIAGNOSIS — R1900 Intra-abdominal and pelvic swelling, mass and lump, unspecified site: Secondary | ICD-10-CM

## 2016-02-29 DIAGNOSIS — D649 Anemia, unspecified: Secondary | ICD-10-CM | POA: Diagnosis not present

## 2016-02-29 DIAGNOSIS — K2960 Other gastritis without bleeding: Secondary | ICD-10-CM | POA: Diagnosis present

## 2016-02-29 DIAGNOSIS — R7989 Other specified abnormal findings of blood chemistry: Secondary | ICD-10-CM | POA: Diagnosis present

## 2016-02-29 DIAGNOSIS — R0789 Other chest pain: Secondary | ICD-10-CM | POA: Diagnosis present

## 2016-02-29 DIAGNOSIS — R911 Solitary pulmonary nodule: Secondary | ICD-10-CM | POA: Diagnosis present

## 2016-02-29 DIAGNOSIS — N179 Acute kidney failure, unspecified: Secondary | ICD-10-CM | POA: Diagnosis not present

## 2016-02-29 DIAGNOSIS — Z880 Allergy status to penicillin: Secondary | ICD-10-CM

## 2016-02-29 DIAGNOSIS — D62 Acute posthemorrhagic anemia: Secondary | ICD-10-CM | POA: Diagnosis present

## 2016-02-29 DIAGNOSIS — E860 Dehydration: Secondary | ICD-10-CM | POA: Diagnosis not present

## 2016-02-29 DIAGNOSIS — J029 Acute pharyngitis, unspecified: Secondary | ICD-10-CM | POA: Diagnosis present

## 2016-02-29 DIAGNOSIS — E1165 Type 2 diabetes mellitus with hyperglycemia: Secondary | ICD-10-CM

## 2016-02-29 DIAGNOSIS — E870 Hyperosmolality and hypernatremia: Secondary | ICD-10-CM

## 2016-02-29 DIAGNOSIS — R55 Syncope and collapse: Secondary | ICD-10-CM | POA: Diagnosis not present

## 2016-02-29 DIAGNOSIS — R509 Fever, unspecified: Secondary | ICD-10-CM | POA: Diagnosis present

## 2016-02-29 DIAGNOSIS — K921 Melena: Secondary | ICD-10-CM | POA: Diagnosis present

## 2016-02-29 DIAGNOSIS — K449 Diaphragmatic hernia without obstruction or gangrene: Secondary | ICD-10-CM | POA: Diagnosis present

## 2016-02-29 DIAGNOSIS — E119 Type 2 diabetes mellitus without complications: Secondary | ICD-10-CM | POA: Diagnosis present

## 2016-02-29 DIAGNOSIS — Z79899 Other long term (current) drug therapy: Secondary | ICD-10-CM

## 2016-02-29 DIAGNOSIS — E785 Hyperlipidemia, unspecified: Secondary | ICD-10-CM | POA: Diagnosis present

## 2016-02-29 DIAGNOSIS — I10 Essential (primary) hypertension: Secondary | ICD-10-CM | POA: Diagnosis present

## 2016-02-29 DIAGNOSIS — J111 Influenza due to unidentified influenza virus with other respiratory manifestations: Secondary | ICD-10-CM

## 2016-02-29 LAB — CBC WITH DIFFERENTIAL/PLATELET
BASOS ABS: 0 10*3/uL (ref 0.0–0.1)
Basophils Relative: 0 %
Eosinophils Absolute: 0.2 10*3/uL (ref 0.0–0.7)
Eosinophils Relative: 2 %
HCT: 27.5 % — ABNORMAL LOW (ref 36.0–46.0)
HEMOGLOBIN: 9.1 g/dL — AB (ref 12.0–15.0)
LYMPHS ABS: 2 10*3/uL (ref 0.7–4.0)
LYMPHS PCT: 21 %
MCH: 28.8 pg (ref 26.0–34.0)
MCHC: 33.1 g/dL (ref 30.0–36.0)
MCV: 87 fL (ref 78.0–100.0)
Monocytes Absolute: 0.6 10*3/uL (ref 0.1–1.0)
Monocytes Relative: 6 %
Neutro Abs: 6.7 10*3/uL (ref 1.7–7.7)
Neutrophils Relative %: 70 %
Platelets: 290 10*3/uL (ref 150–400)
RBC: 3.16 MIL/uL — AB (ref 3.87–5.11)
RDW: 13.2 % (ref 11.5–15.5)
WBC: 9.5 10*3/uL (ref 4.0–10.5)

## 2016-02-29 LAB — COMPREHENSIVE METABOLIC PANEL
ALT: 9 U/L — AB (ref 14–54)
AST: 19 U/L (ref 15–41)
Albumin: 3.4 g/dL — ABNORMAL LOW (ref 3.5–5.0)
Alkaline Phosphatase: 56 U/L (ref 38–126)
Anion gap: 10 (ref 5–15)
BUN: 73 mg/dL — AB (ref 6–20)
CALCIUM: 8.8 mg/dL — AB (ref 8.9–10.3)
CO2: 22 mmol/L (ref 22–32)
CREATININE: 2.42 mg/dL — AB (ref 0.44–1.00)
Chloride: 100 mmol/L — ABNORMAL LOW (ref 101–111)
GFR calc Af Amer: 22 mL/min — ABNORMAL LOW (ref >60)
GFR calc non Af Amer: 19 mL/min — ABNORMAL LOW (ref >60)
Glucose, Bld: 188 mg/dL — ABNORMAL HIGH (ref 65–99)
Potassium: 5.2 mmol/L — ABNORMAL HIGH (ref 3.5–5.1)
Sodium: 132 mmol/L — ABNORMAL LOW (ref 135–145)
Total Bilirubin: 0.4 mg/dL (ref 0.3–1.2)
Total Protein: 7.3 g/dL (ref 6.5–8.1)

## 2016-02-29 LAB — POC OCCULT BLOOD, ED: Fecal Occult Bld: POSITIVE — AB

## 2016-02-29 LAB — TSH: TSH: 4.367 u[IU]/mL (ref 0.350–4.500)

## 2016-02-29 LAB — URINALYSIS, ROUTINE W REFLEX MICROSCOPIC
Bilirubin Urine: NEGATIVE
Glucose, UA: NEGATIVE mg/dL
Ketones, ur: NEGATIVE mg/dL
NITRITE: NEGATIVE
PROTEIN: NEGATIVE mg/dL
Specific Gravity, Urine: 1.009 (ref 1.005–1.030)
pH: 5 (ref 5.0–8.0)

## 2016-02-29 LAB — GLUCOSE, CAPILLARY
GLUCOSE-CAPILLARY: 61 mg/dL — AB (ref 65–99)
Glucose-Capillary: 99 mg/dL (ref 65–99)

## 2016-02-29 LAB — TROPONIN I

## 2016-02-29 LAB — APTT: APTT: 35 s (ref 24–36)

## 2016-02-29 LAB — CBG MONITORING, ED: GLUCOSE-CAPILLARY: 208 mg/dL — AB (ref 65–99)

## 2016-02-29 LAB — PROTIME-INR
INR: 1.06
PROTHROMBIN TIME: 13.8 s (ref 11.4–15.2)

## 2016-02-29 MED ORDER — ONDANSETRON HCL 4 MG PO TABS
4.0000 mg | ORAL_TABLET | Freq: Four times a day (QID) | ORAL | Status: DC | PRN
Start: 2016-02-29 — End: 2016-03-04

## 2016-02-29 MED ORDER — INSULIN ASPART 100 UNIT/ML ~~LOC~~ SOLN
0.0000 [IU] | Freq: Three times a day (TID) | SUBCUTANEOUS | Status: DC
  Administered 2016-03-01: 3 [IU] via SUBCUTANEOUS
  Administered 2016-03-02: 2 [IU] via SUBCUTANEOUS
  Administered 2016-03-02 (×2): 1 [IU] via SUBCUTANEOUS
  Administered 2016-03-03: 2 [IU] via SUBCUTANEOUS
  Administered 2016-03-04: 1 [IU] via SUBCUTANEOUS
  Administered 2016-03-04: 2 [IU] via SUBCUTANEOUS

## 2016-02-29 MED ORDER — OSELTAMIVIR PHOSPHATE 30 MG PO CAPS
30.0000 mg | ORAL_CAPSULE | Freq: Every day | ORAL | Status: DC
  Administered 2016-02-29: 30 mg via ORAL
  Filled 2016-02-29: qty 1

## 2016-02-29 MED ORDER — ACETAMINOPHEN 325 MG PO TABS
650.0000 mg | ORAL_TABLET | Freq: Four times a day (QID) | ORAL | Status: DC | PRN
  Administered 2016-03-04: 650 mg via ORAL
  Filled 2016-02-29: qty 2

## 2016-02-29 MED ORDER — CALCIUM CARBONATE 1250 (500 CA) MG PO TABS
1250.0000 mg | ORAL_TABLET | Freq: Two times a day (BID) | ORAL | Status: DC
  Administered 2016-02-29 – 2016-03-04 (×8): 1250 mg via ORAL
  Filled 2016-02-29 (×8): qty 1

## 2016-02-29 MED ORDER — ACETAMINOPHEN 650 MG RE SUPP: 650.0000 mg | Freq: Four times a day (QID) | RECTAL | Status: DC | PRN

## 2016-02-29 MED ORDER — SIMVASTATIN 20 MG PO TABS
40.0000 mg | ORAL_TABLET | Freq: Every evening | ORAL | Status: DC
  Administered 2016-02-29 – 2016-03-04 (×5): 40 mg via ORAL
  Filled 2016-02-29 (×5): qty 2

## 2016-02-29 MED ORDER — CEFTRIAXONE SODIUM 1 G IJ SOLR
1.0000 g | INTRAMUSCULAR | Status: AC
  Administered 2016-02-29 – 2016-03-03 (×4): 1 g via INTRAVENOUS
  Filled 2016-02-29 (×4): qty 10

## 2016-02-29 MED ORDER — SODIUM CHLORIDE 0.9 % IV SOLN
INTRAVENOUS | Status: DC
Start: 2016-02-29 — End: 2016-02-29
  Filled 2016-02-29 (×2): qty 1000

## 2016-02-29 MED ORDER — INSULIN ASPART 100 UNIT/ML ~~LOC~~ SOLN: 0.0000 [IU] | Freq: Every day | SUBCUTANEOUS | Status: DC

## 2016-02-29 MED ORDER — ONDANSETRON HCL 4 MG/2ML IJ SOLN
4.0000 mg | Freq: Four times a day (QID) | INTRAMUSCULAR | Status: DC | PRN
  Administered 2016-03-02 – 2016-03-03 (×2): 4 mg via INTRAVENOUS
  Filled 2016-02-29 (×2): qty 2

## 2016-02-29 MED ORDER — LINAGLIPTIN 5 MG PO TABS
5.0000 mg | ORAL_TABLET | Freq: Every day | ORAL | Status: DC
  Administered 2016-02-29 – 2016-03-04 (×5): 5 mg via ORAL
  Filled 2016-02-29 (×5): qty 1

## 2016-02-29 MED ORDER — SODIUM CHLORIDE 0.9 % IV SOLN
INTRAVENOUS | Status: DC
  Administered 2016-02-29 – 2016-03-03 (×4): via INTRAVENOUS

## 2016-02-29 MED ORDER — SODIUM CHLORIDE 0.9 % IV SOLN
Freq: Once | INTRAVENOUS | Status: AC
  Administered 2016-02-29: 11:00:00 via INTRAVENOUS

## 2016-02-29 NOTE — ED Notes (Signed)
Pt home health aide leaving, states will notify pt's roommate Daria PasturesCathleen Sexton that pt is here and have her call to provide contact info for discharge transportation if needed.

## 2016-02-29 NOTE — ED Notes (Signed)
Myrtis SerSusie Sexton [phone].

## 2016-02-29 NOTE — ED Provider Notes (Signed)
[facility] Provider Note   CSN: [id number] Arrival date & time: 02/29/16  1034     History   Chief Complaint Chief Complaint  Patient presents with  . Loss of Consciousness    HPI [name] is a 76 y.o. female.  HPI  Past Medical History:  Diagnosis Date  . Diabetes mellitus without complication (HCC)   . Hypercholesterolemia   . Hypertension     Patient Active Problem List   Diagnosis Date Noted  . UTI (urinary tract infection) 09/27/2013  . HTN (hypertension) 09/27/2013  . Diabetes (HCC) 09/27/2013  . Dysphasia 09/27/2013  . Altered mental status 09/27/2013    Past Surgical History:  Procedure Laterality Date  . ABDOMINAL HYSTERECTOMY    . FRACTURE SURGERY      OB History    Gravida Para Term Preterm AB Living   0             SAB TAB Ectopic Multiple Live Births                   Home Medications    Prior to Admission medications   Medication Sig Start Date End Date Taking? Authorizing Provider  calcium carbonate (OS-CAL) 600 MG TABS Take 600 mg by mouth 2 (two) times daily with a meal.    Historical Provider, MD  glipiZIDE-metformin (METAGLIP) 5-500 MG per tablet Take 1 tablet by mouth 2 (two) times daily before a meal.    Historical Provider, MD  hydrochlorothiazide (HYDRODIURIL) 25 MG tablet Take 25 mg by mouth daily as needed (blood pressure).    Historical Provider, MD  HYDROcodone-acetaminophen (VICODIN) 5-500 MG per tablet Take 1 tablet by mouth every 4 (four) hours as needed for pain.    Historical Provider, MD  linagliptin (TRADJENTA) 5 MG TABS tablet Take 5 mg by mouth daily.    Historical Provider, MD  LORazepam (ATIVAN) 0.5 MG tablet Take 0.5 mg by mouth every 4 (four) hours as needed for anxiety.    Historical Provider, MD  nortriptyline (PAMELOR) 25 MG capsule Take 25 mg by mouth 2 (two) times daily.    Historical Provider, MD  ondansetron (ZOFRAN) 4 MG tablet Take 4 mg by mouth every 6 (six) hours as needed for nausea.     Historical Provider, MD  simvastatin (ZOCOR) 40 MG tablet Take 40 mg by mouth every evening.    Historical Provider, MD    Family History No family history on file.  Social History Social History  Substance Use Topics  . Smoking status: Current Some Day Smoker    Types: Cigarettes  . Smokeless tobacco: Never Used  . Alcohol use No     Allergies   Penicillins   Review of Systems Review of Systems   Physical Exam Updated Vital Signs BP (!) 100/54   Pulse 90   Temp 99.4 F (37.4 C)   Resp 21   Ht 5\' 2"  (1.575 m)   Wt 81.6 kg   SpO2 94%   BMI 32.92 kg/m   Physical Exam   ED Treatments / Results  Labs (all labs ordered are listed, but only abnormal results are displayed) Labs Reviewed  CBC WITH DIFFERENTIAL/PLATELET - Abnormal; Notable for the following:       Result Value   RBC 3.16 (*)    Hemoglobin 9.1 (*)    HCT 27.5 (*)    All other components within normal limits  COMPREHENSIVE METABOLIC PANEL - Abnormal; Notable for the following:  Sodium 132 (*)    Potassium 5.2 (*)    Chloride 100 (*)    Glucose, Bld 188 (*)    BUN 73 (*)    Creatinine, Ser 2.42 (*)    Calcium 8.8 (*)    Albumin 3.4 (*)    ALT 9 (*)    GFR calc non Af Amer 19 (*)    GFR calc Af Amer 22 (*)    All other components within normal limits  CBG MONITORING, ED - Abnormal; Notable for the following:    Glucose-Capillary 208 (*)    All other components within normal limits  POC OCCULT BLOOD, ED - Abnormal; Notable for the following:    Fecal Occult Bld POSITIVE (*)    All other components within normal limits  URINALYSIS, ROUTINE W REFLEX MICROSCOPIC    EKG  EKG Interpretation  Date/Time:  Friday February 29 2016 11:06:24 EST Ventricular Rate:  88 PR Interval:    QRS Duration: 97 QT Interval:  362 QTC Calculation: 438 R Axis:   42 Text Interpretation:  Sinus rhythm Low voltage, precordial leads Probable anteroseptal infarct, old No significant change since last tracing  Confirmed by KNAPP  MD-J, JON ([id number]) on 02/29/2016 12:20:11 PM       Radiology No results found.  Procedures Procedures (including critical care time)  Medications Ordered in ED Medications  0.9 %  sodium chloride infusion ( Intravenous Stopped 02/29/16 1319)     Initial Impression / Assessment and Plan / ED Course  I have reviewed the triage vital signs and the nursing notes.  Pertinent labs & imaging results that were available during my care of the patient were reviewed by me and considered in my medical decision making (see chart for details).  Clinical Course as of Feb 29 1336  Fri Feb 29, 2016  1250 Pt seen and examined.  Pt has been having diarrhea.  Labs show renal insufficiency and anemia.  New since 2015 but more recent labs are not available.  Plan on IV fluids, hemoocult, admission for further eval  [JK]    Clinical Course User Index [JK] Linwood DibblesJon Knapp, MD    I spoke to the hospitalist who will admit    Final Clinical Impressions(s) / ED Diagnoses   Final diagnoses:  Syncope and collapse  Anemia, unspecified type  Dehydration  Hypotension, unspecified hypotension type    New Prescriptions New Prescriptions   No medications on file     Elson AreasLeslie K Windell Musson, PA-C 02/29/16 1355    Linwood DibblesJon Knapp, MD 02/29/16 1537

## 2016-02-29 NOTE — ED Provider Notes (Signed)
[facility] Provider Note   CSN: [id number] Arrival date & time: 02/29/16  1034     History   Chief Complaint Chief Complaint  Patient presents with  . Loss of Consciousness    HPI [name] is a 76 y.o. female.  The history is provided by the patient. No language interpreter was used.  Loss of Consciousness   This is a new problem. The problem occurs constantly. The problem has been gradually worsening. She lost consciousness for a period of less than one minute. The problem is associated with normal activity. Associated symptoms include congestion. Pertinent negatives include fever. She has tried nothing for the symptoms. The treatment provided no relief. Her past medical history does not include HTN.  Care giver reports pt has complained of a headache and diarrhea for 2 days.  Pt had two episodes where she collapsed.  The first was unwitnessed.  The second pt gave way but did not lose conciousness  Past Medical History:  Diagnosis Date  . Diabetes mellitus without complication (HCC)   . Hypercholesterolemia   . Hypertension     Patient Active Problem List   Diagnosis Date Noted  . UTI (urinary tract infection) 09/27/2013  . HTN (hypertension) 09/27/2013  . Diabetes (HCC) 09/27/2013  . Dysphasia 09/27/2013  . Altered mental status 09/27/2013    Past Surgical History:  Procedure Laterality Date  . ABDOMINAL HYSTERECTOMY    . FRACTURE SURGERY      OB History    Gravida Para Term Preterm AB Living   0             SAB TAB Ectopic Multiple Live Births                   Home Medications    Prior to Admission medications   Medication Sig Start Date End Date Taking? Authorizing Provider  calcium carbonate (OS-CAL) 600 MG TABS Take 600 mg by mouth 2 (two) times daily with a meal.    Historical Provider, MD  glipiZIDE-metformin (METAGLIP) 5-500 MG per tablet Take 1 tablet by mouth 2 (two) times daily before a meal.    Historical Provider, MD    hydrochlorothiazide (HYDRODIURIL) 25 MG tablet Take 25 mg by mouth daily as needed (blood pressure).    Historical Provider, MD  HYDROcodone-acetaminophen (VICODIN) 5-500 MG per tablet Take 1 tablet by mouth every 4 (four) hours as needed for pain.    Historical Provider, MD  linagliptin (TRADJENTA) 5 MG TABS tablet Take 5 mg by mouth daily.    Historical Provider, MD  LORazepam (ATIVAN) 0.5 MG tablet Take 0.5 mg by mouth every 4 (four) hours as needed for anxiety.    Historical Provider, MD  nortriptyline (PAMELOR) 25 MG capsule Take 25 mg by mouth 2 (two) times daily.    Historical Provider, MD  ondansetron (ZOFRAN) 4 MG tablet Take 4 mg by mouth every 6 (six) hours as needed for nausea.    Historical Provider, MD  simvastatin (ZOCOR) 40 MG tablet Take 40 mg by mouth every evening.    Historical Provider, MD    Family History No family history on file.  Social History Social History  Substance Use Topics  . Smoking status: Current Some Day Smoker    Types: Cigarettes  . Smokeless tobacco: Never Used  . Alcohol use No     Allergies   Penicillins   Review of Systems Review of Systems  Constitutional: Negative for fever.  HENT: Positive for  congestion.   Cardiovascular: Positive for syncope.  All other systems reviewed and are negative.    Physical Exam Updated Vital Signs BP (!) 83/57   Pulse 101   Temp 99.4 F (37.4 C)   Resp 18   Ht 5\' 2"  (1.575 m)   Wt 81.6 kg   SpO2 96%   BMI 32.92 kg/m   Physical Exam  Constitutional: She is oriented to person, place, and time. She appears well-developed and well-nourished.  HENT:  Head: Normocephalic.  Right Ear: External ear normal.  Left Ear: External ear normal.  Nose: Nose normal.  Mouth/Throat: Oropharynx is clear and moist.  Eyes: EOM are normal.  Neck: Normal range of motion.  Cardiovascular: Normal rate and regular rhythm.   Pulmonary/Chest: Effort normal and breath sounds normal.  Abdominal: Soft. She  exhibits no distension.  Musculoskeletal: Normal range of motion.  Neurological: She is alert and oriented to person, place, and time.  Skin: Skin is warm.  Psychiatric: She has a normal mood and affect.  Nursing note and vitals reviewed.    ED Treatments / Results  Labs (all labs ordered are listed, but only abnormal results are displayed) Labs Reviewed  CBC WITH DIFFERENTIAL/PLATELET - Abnormal; Notable for the following:       Result Value   RBC 3.16 (*)    Hemoglobin 9.1 (*)    HCT 27.5 (*)    All other components within normal limits  COMPREHENSIVE METABOLIC PANEL - Abnormal; Notable for the following:    Sodium 132 (*)    Potassium 5.2 (*)    Chloride 100 (*)    Glucose, Bld 188 (*)    BUN 73 (*)    Creatinine, Ser 2.42 (*)    Calcium 8.8 (*)    Albumin 3.4 (*)    ALT 9 (*)    GFR calc non Af Amer 19 (*)    GFR calc Af Amer 22 (*)    All other components within normal limits  CBG MONITORING, ED - Abnormal; Notable for the following:    Glucose-Capillary 208 (*)    All other components within normal limits  URINALYSIS, ROUTINE W REFLEX MICROSCOPIC  POC OCCULT BLOOD, ED    EKG  EKG Interpretation None       Radiology No results found.  Procedures Procedures (including critical care time)  Medications Ordered in ED Medications  0.9 %  sodium chloride infusion ( Intravenous New Bag/Given 02/29/16 1111)     Initial Impression / Assessment and Plan / ED Course  I have reviewed the triage vital signs and the nursing notes.  Pertinent labs & imaging results that were available during my care of the patient were reviewed by me and considered in my medical decision making (see chart for details).  Clinical Course as of Feb 28 1306  Fri Feb 29, 2016  1250 Pt seen and examined.  Pt has been having diarrhea.  Labs show renal insufficiency and anemia.  New since 2015 but more recent labs are not available.  Plan on IV fluids, hemoocult, admission for further  eval  [JK]    Clinical Course User Index [JK] Linwood Dibbles, MD   hemocult positive Pt has a elevated bun and creatinine. Hemoglobin has decreased to 9.1.  I suspect syncope is second to anemai and dehydration.   Final Clinical Impressions(s) / ED Diagnoses   Final diagnoses:  Syncope and collapse  Anemia, unspecified type  Dehydration  Hypotension, unspecified hypotension type  New Prescriptions New Prescriptions   No medications on file     Elson Areas, Cordelia Poche 03/01/16 1610    Linwood Dibbles, MD 03/03/16 1946

## 2016-02-29 NOTE — H&P (Signed)
History and Physical  [name] ZOX:[id number] DOB: 1940/10/19 DOA: 02/29/2016   PCP: Alice Reichert, MD   Patient coming from: Home  Chief Complaint: falls, question syncope  HPI:  [name] is a 76 y.o. female with medical history of diabetes mellitus, cognitive impairment, hypertension, hyperlipidemia presented after having 2 near syncopal episodes on the day of admission. The patient has a history of cognitive impairment, and she is a poor historian. This history is obtained from speaking with the patient's POA and ED PA-C. Apparently, the patient was getting up from a seated position at her table when she lost her balance and fell to the floor. Apparently after some time, she was found by her caretaker awake on the floor. When she tried to stand up again, the patient was weak and fell again without loss of consciousness. It is unclear whether the patient truly lost consciousness on her first fall.  Nevertheless, the patient denies any loss of consciousness. The patient has been complaining of loose stools/diarrhea for the past 2 days with associated headache and subjective fevers. In addition, the patient states that she has seen some black stool. The patient also has been complaining of subjective fevers with sore throat and nonproductive cough for the past 2-3 days. The patient's POA states that she has not heard from the patient for a little over 1 week. She said her daughter to check on the patient at which time it was discovered that the patient's caretakers had not been coming by the house on a regular basis, and the patient had not been taking her medications for nearly one week.  The patient complains of some "soreness"around her left breast/left chest area, but denies any shortness of breath. She denies any hemoptysis. She does have some abdominal cramping associated with her loose stools.  In emergency department, the patient was initially mildly hypotensive with  systolic blood pressure 80 para 57. The patient was given a liter of fluid with improvement up to 100/54. CBC noted hemoglobin of 9.1, WBC 9.5. BMP showed sodium 132, potassium 5.2, serum creatinine 2.52.  FOBT was positive. Due to the patient's troponin hemoglobin when compared to 2 years ago as well as acute kidney injury, admission was requested.  Assessment/Plan: Acute kidney injury -Secondary to volume depletion -Continue IV fluids -Baseline creatinine 1.0-1.1  Near syncope -Likely due to volume depletion although there may be a component of symptomatically anemia -Check orthostatic vital signs -PT evaluation -place on tele  Symptomatically anemia -Presenting hemoglobin 9.1 -Previous hemoglobin 11.6 on 09/28/2013 -Patient has never had any endoscopy or colonoscopy  -Given her melanotic stools and positive Hemoccult, consult gastroenterology   Influenza-like illness -Influenza PCR -Respiratory virus panel -Empiric oseltamivir pending results  Diarrhea -Stool pathogen panel  Pyuria -02/29/16 UA--TNTC WBC -add urine culture -empiric ceftriaxone pending culture data  Hypertension -Holding HCTZ due to soft BP  Diabetes mellitus type 2 -Holding glipizide and metformin -NovoLog sliding scale -Hemoglobin A1c  Hyponatremia -Secondary to volume depletion -Continue IV fluids  Atypical chest pain -personally reviewed EKG--sinus with nonspecific T-wave change -likely from fall -cycle troponins        Past Medical History:  Diagnosis Date  . Diabetes mellitus without complication (HCC)   . Hypercholesterolemia   . Hypertension    Past Surgical History:  Procedure Laterality Date  . ABDOMINAL HYSTERECTOMY    . FRACTURE SURGERY     Social History:  reports that she has been smoking Cigarettes.  She  has never used smokeless tobacco. She reports that she does not drink alcohol or use drugs.   Family History:  Unobtainable secondary to pt's cognitive  impairment  Allergies  Allergen Reactions  . Penicillins Rash    Has patient had a PCN reaction causing immediate rash, facial/tongue/throat swelling, SOB or lightheadedness with hypotension: No Has patient had a PCN reaction causing severe rash involving mucus membranes or skin necrosis: No Has patient had a PCN reaction that required hospitalization No Has patient had a PCN reaction occurring within the last 10 years: No If all of the above answers are "NO", then may proceed with Cephalosporin use.       Prior to Admission medications   Medication Sig Start Date End Date Taking? Authorizing Provider  calcium carbonate (OS-CAL) 600 MG TABS Take 600 mg by mouth 2 (two) times daily with a meal.    Historical Provider, MD  glipiZIDE-metformin (METAGLIP) 5-500 MG per tablet Take 1 tablet by mouth 2 (two) times daily before a meal.    Historical Provider, MD  hydrochlorothiazide (HYDRODIURIL) 25 MG tablet Take 25 mg by mouth daily as needed (blood pressure).    Historical Provider, MD  HYDROcodone-acetaminophen (VICODIN) 5-500 MG per tablet Take 1 tablet by mouth every 4 (four) hours as needed for pain.    Historical Provider, MD  linagliptin (TRADJENTA) 5 MG TABS tablet Take 5 mg by mouth daily.    Historical Provider, MD  LORazepam (ATIVAN) 0.5 MG tablet Take 0.5 mg by mouth every 4 (four) hours as needed for anxiety.    Historical Provider, MD  nortriptyline (PAMELOR) 25 MG capsule Take 25 mg by mouth 2 (two) times daily.    Historical Provider, MD  ondansetron (ZOFRAN) 4 MG tablet Take 4 mg by mouth every 6 (six) hours as needed for nausea.    Historical Provider, MD  simvastatin (ZOCOR) 40 MG tablet Take 40 mg by mouth every evening.    Historical Provider, MD    Review of Systems:  Constitutional:  No weight loss, night sweats,  Head&Eyes: No headache.  No vision loss.  No eye pain or scotoma ENT:  No Difficulty swallowing,Tooth/dental problems,  No ear ache, post nasal drip,   Cardio-vascular:  No Orthopnea, PND, swelling in lower extremities,  dizziness, palpitations  GI:  No  abdominal pain, nausea, vomiting, loss of appetite, hematochezia, melena, heartburn, indigestion, Resp:  No shortness of breath with exertion or at rest.No coughing up of blood .No wheezing.No chest wall deformity  Skin:  no rash or lesions.  GU:  no dysuria, change in color of urine, no urgency or frequency. No flank pain.  Musculoskeletal:  No joint pain or swelling. No decreased range of motion. No back pain.  Psych:  No change in mood or affect.  Neurologic: No headache, no dysesthesia, no focal weakness, no vision loss. No syncope  Physical Exam: Vitals:   02/29/16 1049 02/29/16 1130 02/29/16 1316 02/29/16 1430  BP: (!) 83/57 (!) 93/43 (!) 100/54 117/59  Pulse: 101 87 90 84  Resp: 18 18 21 15   Temp: 99.4 F (37.4 C)     SpO2: 96% 98% 94% 97%  Weight: 81.6 kg (180 lb)     Height: 5\' 2"  (1.575 m)      General:  A&O x 2, NAD, nontoxic, pleasant/cooperative Head/Eye: No conjunctival hemorrhage, no icterus, [place]/AT, No nystagmus ENT:  No icterus,  No thrush, good dentition, no pharyngeal exudate Neck:  No masses, no lymphadenpathy, no bruits CV:  RRR, no rub, no gallop, no S3 Lung:  CTAB, good air movement, no wheeze, no rhonchi Abdomen: soft/NT, +BS, nondistended, no peritoneal signs Ext: No cyanosis, No rashes, No petechiae, No lymphangitis, No edema Neuro: CNII-XII intact, strength 4/5 in bilateral upper and lower extremities, no dysmetria  Labs on Admission:  Basic Metabolic Panel:  Recent Labs Lab 02/29/16 1113  NA 132*  K 5.2*  CL 100*  CO2 22  GLUCOSE 188*  BUN 73*  CREATININE 2.42*  CALCIUM 8.8*   Liver Function Tests:  Recent Labs Lab 02/29/16 1113  AST 19  ALT 9*  ALKPHOS 56  BILITOT 0.4  PROT 7.3  ALBUMIN 3.4*   No results for input(s): LIPASE, AMYLASE in the last 168 hours. No results for input(s): AMMONIA in the last 168  hours. CBC:  Recent Labs Lab 02/29/16 1113  WBC 9.5  NEUTROABS 6.7  HGB 9.1*  HCT 27.5*  MCV 87.0  PLT 290   Coagulation Profile: No results for input(s): INR, PROTIME in the last 168 hours. Cardiac Enzymes: No results for input(s): CKTOTAL, CKMB, CKMBINDEX, TROPONINI in the last 168 hours. BNP: Invalid input(s): POCBNP CBG:  Recent Labs Lab 02/29/16 1058  GLUCAP 208*   Urine analysis:    Component Value Date/Time   COLORURINE YELLOW 02/29/2016 1103   APPEARANCEUR HAZY (A) 02/29/2016 1103   LABSPEC 1.009 02/29/2016 1103   PHURINE 5.0 02/29/2016 1103   GLUCOSEU NEGATIVE 02/29/2016 1103   HGBUR SMALL (A) 02/29/2016 1103   BILIRUBINUR NEGATIVE 02/29/2016 1103   KETONESUR NEGATIVE 02/29/2016 1103   PROTEINUR NEGATIVE 02/29/2016 1103   UROBILINOGEN 0.2 09/27/2013 1300   NITRITE NEGATIVE 02/29/2016 1103   LEUKOCYTESUR LARGE (A) 02/29/2016 1103   Sepsis Labs: @LABRCNTIP (procalcitonin:4,lacticidven:4) )No results found for this or any previous visit (from the past 240 hour(s)).   Radiological Exams on Admission: No results found.  EKG: Independently reviewed. Sinus rhythm, nonspecific T-wave changes    Time spent:60 minutes Code Status:   FULL Family Communication:  POA updated on phone 2/2 Disposition Plan: expect 1-2 day hospitalization Consults called: GI DVT Prophylaxis:   SCDs  Jayda White, DO  [facility] Pager [phone]  If 7PM-7AM, please contact night-coverage [email or web address] Password [email or web address] 02/29/2016, 2:36 PM

## 2016-02-29 NOTE — ED Triage Notes (Signed)
Pt reports syncope x 2 this am . Reports dizziness prior to passing out. Denies cp/ha.

## 2016-03-01 DIAGNOSIS — R0789 Other chest pain: Secondary | ICD-10-CM | POA: Diagnosis present

## 2016-03-01 DIAGNOSIS — K319 Disease of stomach and duodenum, unspecified: Secondary | ICD-10-CM | POA: Diagnosis present

## 2016-03-01 DIAGNOSIS — R7989 Other specified abnormal findings of blood chemistry: Secondary | ICD-10-CM | POA: Diagnosis present

## 2016-03-01 DIAGNOSIS — K228 Other specified diseases of esophagus: Secondary | ICD-10-CM | POA: Diagnosis not present

## 2016-03-01 DIAGNOSIS — B9620 Unspecified Escherichia coli [E. coli] as the cause of diseases classified elsewhere: Secondary | ICD-10-CM | POA: Diagnosis present

## 2016-03-01 DIAGNOSIS — I10 Essential (primary) hypertension: Secondary | ICD-10-CM | POA: Diagnosis present

## 2016-03-01 DIAGNOSIS — K921 Melena: Secondary | ICD-10-CM | POA: Diagnosis present

## 2016-03-01 DIAGNOSIS — N179 Acute kidney failure, unspecified: Secondary | ICD-10-CM | POA: Diagnosis present

## 2016-03-01 DIAGNOSIS — E860 Dehydration: Secondary | ICD-10-CM | POA: Diagnosis not present

## 2016-03-01 DIAGNOSIS — K2960 Other gastritis without bleeding: Secondary | ICD-10-CM | POA: Diagnosis present

## 2016-03-01 DIAGNOSIS — W010XXA Fall on same level from slipping, tripping and stumbling without subsequent striking against object, initial encounter: Secondary | ICD-10-CM | POA: Diagnosis present

## 2016-03-01 DIAGNOSIS — J029 Acute pharyngitis, unspecified: Secondary | ICD-10-CM | POA: Diagnosis present

## 2016-03-01 DIAGNOSIS — K439 Ventral hernia without obstruction or gangrene: Secondary | ICD-10-CM | POA: Diagnosis present

## 2016-03-01 DIAGNOSIS — R197 Diarrhea, unspecified: Secondary | ICD-10-CM | POA: Diagnosis present

## 2016-03-01 DIAGNOSIS — F17210 Nicotine dependence, cigarettes, uncomplicated: Secondary | ICD-10-CM | POA: Diagnosis present

## 2016-03-01 DIAGNOSIS — K3189 Other diseases of stomach and duodenum: Secondary | ICD-10-CM | POA: Diagnosis not present

## 2016-03-01 DIAGNOSIS — E1165 Type 2 diabetes mellitus with hyperglycemia: Secondary | ICD-10-CM | POA: Diagnosis not present

## 2016-03-01 DIAGNOSIS — D62 Acute posthemorrhagic anemia: Secondary | ICD-10-CM | POA: Diagnosis present

## 2016-03-01 DIAGNOSIS — I959 Hypotension, unspecified: Secondary | ICD-10-CM | POA: Diagnosis not present

## 2016-03-01 DIAGNOSIS — N390 Urinary tract infection, site not specified: Secondary | ICD-10-CM | POA: Diagnosis present

## 2016-03-01 DIAGNOSIS — R509 Fever, unspecified: Secondary | ICD-10-CM | POA: Diagnosis present

## 2016-03-01 DIAGNOSIS — R69 Illness, unspecified: Secondary | ICD-10-CM | POA: Diagnosis not present

## 2016-03-01 DIAGNOSIS — D649 Anemia, unspecified: Secondary | ICD-10-CM | POA: Diagnosis not present

## 2016-03-01 DIAGNOSIS — R4189 Other symptoms and signs involving cognitive functions and awareness: Secondary | ICD-10-CM | POA: Diagnosis present

## 2016-03-01 DIAGNOSIS — K210 Gastro-esophageal reflux disease with esophagitis: Secondary | ICD-10-CM | POA: Diagnosis present

## 2016-03-01 DIAGNOSIS — R55 Syncope and collapse: Secondary | ICD-10-CM | POA: Diagnosis present

## 2016-03-01 DIAGNOSIS — E119 Type 2 diabetes mellitus without complications: Secondary | ICD-10-CM | POA: Diagnosis present

## 2016-03-01 DIAGNOSIS — E871 Hypo-osmolality and hyponatremia: Secondary | ICD-10-CM | POA: Diagnosis present

## 2016-03-01 DIAGNOSIS — R05 Cough: Secondary | ICD-10-CM | POA: Diagnosis present

## 2016-03-01 DIAGNOSIS — K449 Diaphragmatic hernia without obstruction or gangrene: Secondary | ICD-10-CM | POA: Diagnosis present

## 2016-03-01 LAB — GLUCOSE, CAPILLARY
Glucose-Capillary: 117 mg/dL — ABNORMAL HIGH (ref 65–99)
Glucose-Capillary: 208 mg/dL — ABNORMAL HIGH (ref 65–99)
Glucose-Capillary: 93 mg/dL (ref 65–99)
Glucose-Capillary: 131 mg/dL — ABNORMAL HIGH (ref 65–99)

## 2016-03-01 LAB — RESPIRATORY PANEL BY PCR
Adenovirus: NOT DETECTED
BORDETELLA PERTUSSIS-RVPCR: NOT DETECTED
CHLAMYDOPHILA PNEUMONIAE-RVPPCR: NOT DETECTED
CORONAVIRUS 229E-RVPPCR: NOT DETECTED
CORONAVIRUS HKU1-RVPPCR: NOT DETECTED
Coronavirus NL63: NOT DETECTED
Coronavirus OC43: NOT DETECTED
INFLUENZA B-RVPPCR: NOT DETECTED
Influenza A: NOT DETECTED
METAPNEUMOVIRUS-RVPPCR: NOT DETECTED
MYCOPLASMA PNEUMONIAE-RVPPCR: NOT DETECTED
Parainfluenza Virus 1: NOT DETECTED
Parainfluenza Virus 2: NOT DETECTED
Parainfluenza Virus 3: NOT DETECTED
Parainfluenza Virus 4: NOT DETECTED
RESPIRATORY SYNCYTIAL VIRUS-RVPPCR: NOT DETECTED
Rhinovirus / Enterovirus: NOT DETECTED

## 2016-03-01 LAB — CBC
HEMATOCRIT: 23.5 % — AB (ref 36.0–46.0)
HEMOGLOBIN: 7.7 g/dL — AB (ref 12.0–15.0)
MCH: 28.7 pg (ref 26.0–34.0)
MCHC: 32.8 g/dL (ref 30.0–36.0)
MCV: 87.7 fL (ref 78.0–100.0)
Platelets: 246 10*3/uL (ref 150–400)
RBC: 2.68 MIL/uL — ABNORMAL LOW (ref 3.87–5.11)
RDW: 13.6 % (ref 11.5–15.5)
WBC: 5.5 10*3/uL (ref 4.0–10.5)

## 2016-03-01 LAB — BASIC METABOLIC PANEL
ANION GAP: 5 (ref 5–15)
BUN: 52 mg/dL — ABNORMAL HIGH (ref 6–20)
CO2: 22 mmol/L (ref 22–32)
Calcium: 8.6 mg/dL — ABNORMAL LOW (ref 8.9–10.3)
Chloride: 112 mmol/L — ABNORMAL HIGH (ref 101–111)
Creatinine, Ser: 1.52 mg/dL — ABNORMAL HIGH (ref 0.44–1.00)
GFR calc Af Amer: 38 mL/min — ABNORMAL LOW (ref >60)
GFR calc non Af Amer: 33 mL/min — ABNORMAL LOW (ref >60)
GLUCOSE: 118 mg/dL — AB (ref 65–99)
POTASSIUM: 5.1 mmol/L (ref 3.5–5.1)
Sodium: 139 mmol/L (ref 135–145)

## 2016-03-01 LAB — HEMOGLOBIN AND HEMATOCRIT, BLOOD
HEMATOCRIT: 27.1 % — AB (ref 36.0–46.0)
Hemoglobin: 9.1 g/dL — ABNORMAL LOW (ref 12.0–15.0)

## 2016-03-01 LAB — ABO/RH: ABO/RH(D): A POS

## 2016-03-01 LAB — PREPARE RBC (CROSSMATCH)

## 2016-03-01 LAB — INFLUENZA PANEL BY PCR (TYPE A & B)
Influenza A By PCR: NEGATIVE
Influenza B By PCR: NEGATIVE

## 2016-03-01 LAB — TROPONIN I: Troponin I: <0.03 ng/mL (ref <0.03)

## 2016-03-01 MED ORDER — ACETAMINOPHEN 325 MG PO TABS
650.0000 mg | ORAL_TABLET | Freq: Once | ORAL | Status: AC
  Administered 2016-03-01: 650 mg via ORAL
  Filled 2016-03-01: qty 2

## 2016-03-01 MED ORDER — DIPHENHYDRAMINE HCL 25 MG PO CAPS
25.0000 mg | ORAL_CAPSULE | Freq: Once | ORAL | Status: AC
  Administered 2016-03-01: 25 mg via ORAL
  Filled 2016-03-01: qty 1

## 2016-03-01 MED ORDER — SODIUM CHLORIDE 0.9 % IV SOLN
Freq: Once | INTRAVENOUS | Status: AC
  Administered 2016-03-01: 15:00:00 via INTRAVENOUS

## 2016-03-01 MED ORDER — PANTOPRAZOLE SODIUM 40 MG IV SOLR
40.0000 mg | Freq: Two times a day (BID) | INTRAVENOUS | Status: DC
  Administered 2016-03-01 – 2016-03-02 (×4): 40 mg via INTRAVENOUS
  Filled 2016-03-01 (×4): qty 40

## 2016-03-01 NOTE — Consult Note (Signed)
Referring Provider: Catarina Hartshorn, MD Primary Care Physician:  ? Primary Gastroenterologist:  Dr. Karilyn Cota  Reason for Consultation:    GI bleed and anemia.  HPI:   Patient is a poor historian. History obtained from the patient and her chart. I did briefly talk with patient's power of attorney Ms. Verdon Cummins over the phone. When I went to the floor to meet with her in person she was gone.  Patient states she has not been feeling well for the last several days. Yesterday she passed out when she tried to stand up. She was sitting in a chair. She apparently regained consciousness very quickly. With the help she was able to walk to the [facility] was brought to emergency room. Patient apparently has had diarrhea for the last few days. Her stools have been black. She also complains of intermittent headache and has been taking BC powder. She feels she does not take him to often. She states she has a very good appetite and has not lost any weight. She also complains of flatulence and gas pain. She denies nausea vomiting heartburn or dysphagia. There is no history of peptic ulcer disease. She also denies weight loss. She is aware that her urinalysis was abnormal and she is being treated for urinary tract infection. In emergency room she was noted to have hemoglobin of 9.1 and this morning has dropped to 7.7. Patient states she dropped out of school when she was in sixth grade. She states she was not raised by her mother. She was raised by another woman. She has been living with another lady for about a year. She states she got married in 1968 by separated in 1977. She does not smoke cigarettes or drink alcohol. According to Ms. Lin Givens who has a power of attorney she was a slow learner and learn to walk when she was 76 years old. No further details are available as to prior evaluation for cognitive function.  Patient is presently on ceftriaxone for urinary tract infection.. She had troponin levels x 3 and  these were normal. Influenza panel was negative. On admission patient was felt to be dehydrated with elevated BUN and creatinine and she has been hydrated.    Past Medical History:  Diagnosis Date  . Diabetes mellitus without complication (HCC)   . Hypercholesterolemia   . Hypertension         Cognitive impairment. It is unclear whether she has dementia or mental retardation.  Past Surgical History:  Procedure Laterality Date  . ABDOMINAL HYSTERECTOMY    . FRACTURE SURGERY      Prior to Admission medications   Medication Sig Start Date End Date Taking? Authorizing Provider  calcium carbonate (OS-CAL) 600 MG TABS Take 600 mg by mouth 2 (two) times daily with a meal.   Yes Historical Provider, MD  escitalopram (LEXAPRO) 10 MG tablet Take 10 mg by mouth daily.   Yes Historical Provider, MD  furosemide (LASIX) 20 MG tablet Take 20 mg by mouth daily.   Yes Historical Provider, MD  gabapentin (NEURONTIN) 300 MG capsule Take 300 mg by mouth 3 (three) times daily.   Yes Historical Provider, MD  glipiZIDE-metformin (METAGLIP) 5-500 MG per tablet Take 1 tablet by mouth 2 (two) times daily before a meal.   Yes Historical Provider, MD  hydrochlorothiazide (HYDRODIURIL) 25 MG tablet Take 25 mg by mouth daily as needed (blood pressure).   Yes Historical Provider, MD  lisinopril (PRINIVIL,ZESTRIL) 20 MG tablet Take 20 mg by mouth daily.  Yes Historical Provider, MD  LORazepam (ATIVAN) 0.5 MG tablet Take 0.5 mg by mouth every 4 (four) hours as needed for anxiety.   Yes Historical Provider, MD  mirabegron ER (MYRBETRIQ) 25 MG TB24 tablet Take 25 mg by mouth daily.   Yes Historical Provider, MD  simvastatin (ZOCOR) 40 MG tablet Take 40 mg by mouth every evening.   Yes Historical Provider, MD    Current Facility-Administered Medications  Medication Dose Route Frequency Provider Last Rate Last Dose  . 0.9 %  sodium chloride infusion   Intravenous Continuous Catarina Hartshornavid Tat, MD 75 mL/hr at 03/01/16 0503     . acetaminophen (TYLENOL) tablet 650 mg  650 mg Oral Q6H PRN Catarina Hartshornavid Tat, MD       Or  . acetaminophen (TYLENOL) suppository 650 mg  650 mg Rectal Q6H PRN Catarina Hartshornavid Tat, MD      . calcium carbonate (OS-CAL - dosed in mg of elemental calcium) tablet 1,250 mg  1,250 mg Oral BID WC Catarina Hartshornavid Tat, MD   1,250 mg at 03/01/16 0826  . cefTRIAXone (ROCEPHIN) 1 g in dextrose 5 % 50 mL IVPB  1 g Intravenous Q24H Catarina Hartshornavid Tat, MD   1 g at 02/29/16 1726  . insulin aspart (novoLOG) injection 0-5 Units  0-5 Units Subcutaneous QHS David Tat, MD      . insulin aspart (novoLOG) injection 0-9 Units  0-9 Units Subcutaneous TID WC Catarina Hartshornavid Tat, MD   3 Units at 03/01/16 1132  . linagliptin (TRADJENTA) tablet 5 mg  5 mg Oral Daily Catarina Hartshornavid Tat, MD   5 mg at 03/01/16 1122  . ondansetron (ZOFRAN) tablet 4 mg  4 mg Oral Q6H PRN Catarina Hartshornavid Tat, MD       Or  . ondansetron [facility](ZOFRAN) injection 4 mg  4 mg Intravenous Q6H PRN Catarina Hartshornavid Tat, MD      . simvastatin (ZOCOR) tablet 40 mg  40 mg Oral QPM Catarina Hartshornavid Tat, MD   40 mg at 02/29/16 1722    Allergies as of 02/29/2016 - Review Complete 02/29/2016  Allergen Reaction Noted  . Penicillins Rash 08/01/2010    History reviewed. No pertinent family history.  Social History   Social History  . Marital status: Legally Separated    Spouse name: N/A  . Number of children: N/A  . Years of education: N/A   Occupational History  . Not on file.   Social History Main Topics  . Smoking status: Current Some Day Smoker    Packs/day: 1.00    Types: Cigarettes  . Smokeless tobacco: Never Used  . Alcohol use No  . Drug use: No  . Sexual activity: Not Currently    Birth control/ protection: Surgical   Other Topics Concern  . Not on file   Social History Narrative  . No narrative on file    Review of Systems: See HPI, otherwise normal ROS  Physical Exam: Temp:  [98.2 F (36.8 C)] 98.2 F (36.8 C) (02/03 0448) Pulse Rate:  [84-90] 84 (02/03 0448) Resp:  [15-23] 18 (02/03 0448) BP:  (100-135)/(45-59) 135/59 (02/03 0448) SpO2:  [94 %-100 %] 97 % (02/03 0448) Weight:  [177 lb 12.8 oz (80.6 kg)] 177 lb 12.8 oz (80.6 kg) (02/02 1604) Last BM Date: 02/28/16  Patient is alert and in no acute distress. She responds appropriately to simple questions. She is very pale. Conjunctiva is also pale and sclerae nonicteric. Oropharyngeal mucosa is normal. She has few remaining teeth. No neck masses or thyromegaly noted. Cardiac exam  with regular rhythm normal S1 and S2. No murmur or gallop noted. Abdomen is full. Bowel sounds are normal. On palpation it is soft and nontender there is fullness in hypogastric region to the left of midline. No hepatosplenomegaly. Rectal examination deferred. She does not have better for edema or clubbing.  Lab Results:  Recent Labs  02/29/16 1113 03/01/16 0536  WBC 9.5 5.5  HGB 9.1* 7.7*  HCT 27.5* 23.5*  PLT 290 246   BMET  Recent Labs  02/29/16 1113 03/01/16 0536  NA 132* 139  K 5.2* 5.1  CL 100* 112*  CO2 22 22  GLUCOSE 188* 118*  BUN 73* 52*  CREATININE 2.42* 1.52*  CALCIUM 8.8* 8.6*   LFT  Recent Labs  02/29/16 1113  PROT 7.3  ALBUMIN 3.4*  AST 19  ALT 9*  ALKPHOS 56  BILITOT 0.4   PT/INR  Recent Labs  02/29/16 1801  LABPROT 13.8  INR 1.06    Assessment;  Patient is 76 year old Caucasian female with impaired cognitive function who presents with syncopal episode history of melena and found to be in endemic as well as azotemic. Azotemia felt to be due to dehydration and renal function is improving. On admission she was noted to have hemoglobin of 9.1 g and it has dropped to 7.7 g. She has not had frank melena since hospitalized. H&H drop appears to be due to hydration. She has been taking BC powder for headache. She therefore possibly has peptic ulcer disease. Patient will be evaluated with esophagogastroduodenoscopy but I doubt that she can be examined with conscious sedation and will need monitored anesthesia  care. Since she is not actively bleeding procedure will be delayed until 03/04/2011. Will benefit from unit of PRBCs.  Recommendations;   Pantoprazole 40 mg IV every 12 hours Will transfuse with 1 unit of PRBCs today. CBC with a.m. Lab. Diagnostic esophagogastroduodenoscopy under monitored anesthesia care on 03/03/2016.   LOS: 0 days   Tamas Suen  03/01/2016, 12:06 PM

## 2016-03-01 NOTE — Progress Notes (Signed)
PROGRESS NOTE  [name] WUJ:[id number] DOB: 09-15-1940 DOA: 02/29/2016 PCP: Alice Reichert, MD  Brief History:  76 y.o. female with medical history of diabetes mellitus, cognitive impairment, hypertension, hyperlipidemia presented after having 2 near syncopal episodes on the day of admission. The patient has a history of cognitive impairment, and she is a poor historian.  Apparently, the patient was getting up from a seated position at her table when she lost her balance and fell to the floor. Apparently after some time, she was found by her caretaker awake on the floor. When she tried to stand up again, the patient was weak and fell again without loss of consciousness. It is unclear whether the patient truly lost consciousness on her first fall.  Nevertheless, the patient denies any loss of consciousness. The patient has been complaining of loose stools/diarrhea for the past 2 days with associated headache and subjective fevers. In addition, the patient states that she has seen some black stool. GI was consulted to assist in management.  Assessment/Plan: Acute kidney injury -Secondary to volume depletion/dehydration -Continue IV fluids-->improving -Baseline creatinine 1.0-1.1  Near syncope -Likely due to volume depletion although there may be a component of symptomatically anemia -Check orthostatic vital signs--marginally positive with HR increase by 17 beats from seated to standing -PT evaluation -place on tele--no concerning dysrhythmias  Symptomatically anemia/melena -Presenting hemoglobin 9.1 -Previous hemoglobin 11.6 on 09/28/2013 -Patient has never had any endoscopy or colonoscopy  -Given her melanotic stools and positive Hemoccult, consult gastroenterology  -plans noted for EGD 03/03/16  Influenza-like illness -Influenza PCR--neg -Respiratory virus panel--pending  Diarrhea -Stool pathogen panel -no BM since admission  Pyuria -02/29/16 UA--TNTC  WBC -follow urine culture -empiric ceftriaxone pending culture data  Hypertension -Holding HCTZ due to soft BP  Diabetes mellitus type 2 -Holding glipizide and metformin -NovoLog sliding scale -Hemoglobin A1c-pending  Hyponatremia -Secondary to volume depletion -Continue IV fluids--> improved  Atypical chest pain -personally reviewed EKG--sinus with nonspecific T-wave change -likely from fall -cycle troponins--negative  Cognitive impairment -I'm concerned that the patient will not be able to go home to her previous living arrangement -I have asked her POA, Ms. Rosendo Gros to bring in a formal paperwork to document that she is indeed the patient's HPOA    Disposition Plan:   Pending endoscopy on 03/03/16 Family Communication:   Family at bedside  Consultants:  GI  Code Status:  FULL   DVT Prophylaxis:  SCDs   Procedures: As Listed in Progress Note Above  Antibiotics: None    Subjective: Patient denies fevers, chills, headache, chest pain, dyspnea, nausea, vomiting, diarrhea, abdominal pain,  hematuria, hematochezia, and melena. She complains of dysuria   Objective: Vitals:   02/29/16 1507 02/29/16 1604 02/29/16 2050 03/01/16 0448  BP:  (!) 127/56 (!) 132/45 (!) 135/59  Pulse:  90 85 84  Resp:  18 18 18   Temp:  98.2 F (36.8 C) 98.2 F (36.8 C) 98.2 F (36.8 C)  TempSrc:  Oral Oral Oral  SpO2: 98% 100% 99% 97%  Weight:  80.6 kg (177 lb 12.8 oz)    Height:  5\' 2"  (1.575 m)      Intake/Output Summary (Last 24 hours) at 03/01/16 1116 Last data filed at 03/01/16 0400  Gross per 24 hour  Intake           2327.5 ml  Output  0 ml  Net           2327.5 ml   Weight change:  Exam:   General:  Pt is alert, follows commands appropriately, not in acute distress  HEENT: No icterus, No thrush, No neck mass, [place]/AT  Cardiovascular: RRR, S1/S2, no rubs, no gallops  Respiratory: CTA bilaterally, no wheezing, no crackles, no  rhonchi  Abdomen: Soft/+BS, non tender, non distended, no guarding  Extremities: 1 + LE edema, No lymphangitis, No petechiae, No rashes, no synovitis   Data Reviewed: I have personally reviewed following labs and imaging studies Basic Metabolic Panel:  Recent Labs Lab 02/29/16 1113 03/01/16 0536  NA 132* 139  K 5.2* 5.1  CL 100* 112*  CO2 22 22  GLUCOSE 188* 118*  BUN 73* 52*  CREATININE 2.42* 1.52*  CALCIUM 8.8* 8.6*   Liver Function Tests:  Recent Labs Lab 02/29/16 1113  AST 19  ALT 9*  ALKPHOS 56  BILITOT 0.4  PROT 7.3  ALBUMIN 3.4*   No results for input(s): LIPASE, AMYLASE in the last 168 hours. No results for input(s): AMMONIA in the last 168 hours. Coagulation Profile:  Recent Labs Lab 02/29/16 1801  INR 1.06   CBC:  Recent Labs Lab 02/29/16 1113 03/01/16 0536  WBC 9.5 5.5  NEUTROABS 6.7  --   HGB 9.1* 7.7*  HCT 27.5* 23.5*  MCV 87.0 87.7  PLT 290 246   Cardiac Enzymes:  Recent Labs Lab 02/29/16 1801 03/01/16 0536  TROPONINI <0.03 <0.03   BNP: Invalid input(s): POCBNP CBG:  Recent Labs Lab 02/29/16 1058 02/29/16 1641 02/29/16 2139 03/01/16 0745  GLUCAP 208* 61* 99 117*   HbA1C: No results for input(s): HGBA1C in the last 72 hours. Urine analysis:    Component Value Date/Time   COLORURINE YELLOW 02/29/2016 1103   APPEARANCEUR HAZY (A) 02/29/2016 1103   LABSPEC 1.009 02/29/2016 1103   PHURINE 5.0 02/29/2016 1103   GLUCOSEU NEGATIVE 02/29/2016 1103   HGBUR SMALL (A) 02/29/2016 1103   BILIRUBINUR NEGATIVE 02/29/2016 1103   KETONESUR NEGATIVE 02/29/2016 1103   PROTEINUR NEGATIVE 02/29/2016 1103   UROBILINOGEN 0.2 09/27/2013 1300   NITRITE NEGATIVE 02/29/2016 1103   LEUKOCYTESUR LARGE (A) 02/29/2016 1103   Sepsis Labs: @LABRCNTIP (procalcitonin:4,lacticidven:4) )No results found for this or any previous visit (from the past 240 hour(s)).   Scheduled Meds: . calcium carbonate  1,250 mg Oral BID WC  . cefTRIAXone  (ROCEPHIN)  IV  1 g Intravenous Q24H  . insulin aspart  0-5 Units Subcutaneous QHS  . insulin aspart  0-9 Units Subcutaneous TID WC  . linagliptin  5 mg Oral Daily  . simvastatin  40 mg Oral QPM   Continuous Infusions: . sodium chloride 75 mL/hr at 03/01/16 0503    Procedures/Studies: No results found.  Ashani Pumphrey, DO  [facility] Pager [phone]  If 7PM-7AM, please contact night-coverage [email or web address] Password [email or web address] 03/01/2016, 11:16 AM   LOS: 0 days

## 2016-03-02 ENCOUNTER — Inpatient Hospital Stay (HOSPITAL_COMMUNITY): Payer: Medicare Other

## 2016-03-02 DIAGNOSIS — E871 Hypo-osmolality and hyponatremia: Secondary | ICD-10-CM

## 2016-03-02 LAB — TYPE AND SCREEN
ABO/RH(D): A POS
ANTIBODY SCREEN: NEGATIVE
Unit division: 0

## 2016-03-02 LAB — GLUCOSE, CAPILLARY
GLUCOSE-CAPILLARY: 132 mg/dL — AB (ref 65–99)
GLUCOSE-CAPILLARY: 136 mg/dL — AB (ref 65–99)
Glucose-Capillary: 155 mg/dL — ABNORMAL HIGH (ref 65–99)

## 2016-03-02 LAB — HEMOGLOBIN A1C
Hgb A1c MFr Bld: 7.1 % — ABNORMAL HIGH (ref 4.8–5.6)
Mean Plasma Glucose: 157 mg/dL

## 2016-03-02 LAB — BASIC METABOLIC PANEL
Anion gap: 5 (ref 5–15)
BUN: 24 mg/dL — AB (ref 6–20)
CALCIUM: 8.7 mg/dL — AB (ref 8.9–10.3)
CO2: 24 mmol/L (ref 22–32)
CREATININE: 1.51 mg/dL — AB (ref 0.44–1.00)
Chloride: 106 mmol/L (ref 101–111)
GFR calc non Af Amer: 33 mL/min — ABNORMAL LOW (ref >60)
GFR, EST AFRICAN AMERICAN: 38 mL/min — AB (ref >60)
GLUCOSE: 143 mg/dL — AB (ref 65–99)
Potassium: 5.6 mmol/L — ABNORMAL HIGH (ref 3.5–5.1)
Sodium: 135 mmol/L (ref 135–145)

## 2016-03-02 MED ORDER — DEXTROSE 5 % IV SOLN
INTRAVENOUS | Status: AC
  Filled 2016-03-02: qty 10

## 2016-03-02 MED ORDER — IOPAMIDOL (ISOVUE-300) INJECTION 61%
INTRAVENOUS | Status: AC
  Filled 2016-03-02: qty 30

## 2016-03-02 MED ORDER — SODIUM CHLORIDE 0.9 % IV SOLN: INTRAVENOUS | Status: DC

## 2016-03-02 NOTE — Progress Notes (Addendum)
PROGRESS NOTE  [name] UJW:[id number] DOB: September 01, 1940 DOA: 02/29/2016 PCP: Alice Reichert, MD  Brief History:  76 y.o.femalewith medical history of diabetes mellitus, cognitive impairment, hypertension, hyperlipidemia presented after having 2 near syncopal episodes on the day of admission. The patient has a history of cognitive impairment, and she is a poor historian.  Apparently, the patient was getting up from a seated position at her table when she lost her balance and fell to the floor. Apparently after some time, she was found by her caretaker awake on the floor. When she tried to stand up again, the patient was weak and fell again without loss of consciousness. It is unclear whether the patient truly lost consciousness on her first fall. Nevertheless, the patient denies any loss of consciousness. The patient has been complaining of loose stools/diarrhea for the past 2 days with associated headache and subjective fevers. In addition, the patient states that she has seen some black stool. GI was consulted to assist in management.  Assessment/Plan: Acute kidney injury -Secondary to volume depletion/dehydration -Continue IV fluids-->improving -Baseline creatinine 1.0-1.1 -am BMP  Near syncope -Likely due to volume depletion although there may be a component of symptomatically anemia -Check orthostatic vital signs--marginally positive with HR increase by 17 beats from seated to standing -PT evaluation -place on tele--no concerning dysrhythmias-->d/c tele  Symptomatically anemia/melena -Presentinghemoglobin 9.1-->7.7-->transfuse one unit PRBC -transfused one unit PRBC 03/01/16-->Hgb 9.1 -Previous hemoglobin 11.6 on 09/28/2013 -Patient has never had any endoscopy or colonoscopy  -Given her melanotic stools and positive Hemoccult, consulted gastroenterology  -plans noted for EGD 03/03/16--discussed with Dr. Rehman-->CT abdomen (?ventral hernia--LLQ  firmness)  Influenza-like illness -Influenza PCR--neg -Respiratory virus panel--neg  Diarrhea -Stool pathogen panel -no BM since admission-->D/C enteric precautions  UTI--Ecoli -02/29/16 UA--TNTC WBC -follow urine culture -empiric ceftriaxone pending culture data  Hypertension -Holding HCTZ due to soft BP-->improved  Diabetes mellitus type 2 -Holding glipizide and metformin -NovoLog sliding scale -Hemoglobin A1c-7.1  Hyponatremia -Secondary to volume depletion -Continue IV fluids--> improved  Atypical chest pain -personally reviewed EKG--sinus with nonspecific T-wave change -likely from fall -cycle troponins--negative  Cognitive impairment -I'm concerned that the patient will not be able to go home to her previous living arrangement -I have asked her POA, Ms. Rosendo Gros to bring in a formal paperwork to document that she is indeed the patient's HPOA    Disposition Plan:  SNF 03/03/16 if EGD/CT abd is unremarkable Family Communication:   POA updated 03/01/16  Consultants:  GI  Code Status:  FULL   DVT Prophylaxis:  SCDs   Procedures: As Listed in Progress Note Above  Antibiotics: None  Subjective: Patient denies fevers, chills, headache, chest pain, dyspnea, nausea, vomiting, diarrhea, abdominal pain, dysuria, hematuria, hematochezia, and melena.   Objective: Vitals:   03/01/16 2100 03/01/16 2228 03/02/16 0641 03/02/16 1500  BP: 124/67 123/66 (!) 141/75 129/77  Pulse: 77 (!) 57 82 76  Resp: 18 18 18 18   Temp:  99.2 F (37.3 C) 97.6 F (36.4 C) 97.8 F (36.6 C)  TempSrc: Oral Oral Oral Oral  SpO2: 97% 95% 99% 98%  Weight:      Height:        Intake/Output Summary (Last 24 hours) at 03/02/16 1516 Last data filed at 03/02/16 1500  Gross per 24 hour  Intake          1960.83 ml  Output  500 ml  Net          1460.83 ml   Weight change:  Exam:   General:  Pt is alert, follows commands appropriately, not in acute  distress  HEENT: No icterus, No thrush, No neck mass, [place]/AT  Cardiovascular: RRR, S1/S2, no rubs, no gallops  Respiratory: Left basilar crackles. Right clear to auscultation. No wheezing. Good air movement.  Abdomen: Soft/+BS, non tender, non distended, no guarding  Extremities: trace nonpitting edema, No lymphangitis, No petechiae, No rashes, no synovitis   Data Reviewed: I have personally reviewed following labs and imaging studies Basic Metabolic Panel:  Recent Labs Lab 02/29/16 1113 03/01/16 0536  NA 132* 139  K 5.2* 5.1  CL 100* 112*  CO2 22 22  GLUCOSE 188* 118*  BUN 73* 52*  CREATININE 2.42* 1.52*  CALCIUM 8.8* 8.6*   Liver Function Tests:  Recent Labs Lab 02/29/16 1113  AST 19  ALT 9*  ALKPHOS 56  BILITOT 0.4  PROT 7.3  ALBUMIN 3.4*   No results for input(s): LIPASE, AMYLASE in the last 168 hours. No results for input(s): AMMONIA in the last 168 hours. Coagulation Profile:  Recent Labs Lab 02/29/16 1801  INR 1.06   CBC:  Recent Labs Lab 02/29/16 1113 03/01/16 0536 03/01/16 2305  WBC 9.5 5.5  --   NEUTROABS 6.7  --   --   HGB 9.1* 7.7* 9.1*  HCT 27.5* 23.5* 27.1*  MCV 87.0 87.7  --   PLT 290 246  --    Cardiac Enzymes:  Recent Labs Lab 02/29/16 1801 03/01/16 0536  TROPONINI <0.03 <0.03   BNP: Invalid input(s): POCBNP CBG:  Recent Labs Lab 03/01/16 1125 03/01/16 1632 03/01/16 2213 03/02/16 0725 03/02/16 1125  GLUCAP 208* 93 131* 132* 155*   HbA1C:  Recent Labs  02/29/16 1812  HGBA1C 7.1*   Urine analysis:    Component Value Date/Time   COLORURINE YELLOW 02/29/2016 1103   APPEARANCEUR HAZY (A) 02/29/2016 1103   LABSPEC 1.009 02/29/2016 1103   PHURINE 5.0 02/29/2016 1103   GLUCOSEU NEGATIVE 02/29/2016 1103   HGBUR SMALL (A) 02/29/2016 1103   BILIRUBINUR NEGATIVE 02/29/2016 1103   KETONESUR NEGATIVE 02/29/2016 1103   PROTEINUR NEGATIVE 02/29/2016 1103   UROBILINOGEN 0.2 09/27/2013 1300   NITRITE NEGATIVE  02/29/2016 1103   LEUKOCYTESUR LARGE (A) 02/29/2016 1103   Sepsis Labs: @LABRCNTIP (procalcitonin:4,lacticidven:4) ) Recent Results (from the past 240 hour(s))  Culture, Urine     Status: Abnormal (Preliminary result)   Collection Time: 02/29/16 11:03 AM  Result Value Ref Range Status   Specimen Description URINE, RANDOM  Final   Special Requests NONE  Final   Culture (A)  Final    80,000 COLONIES/mL ESCHERICHIA COLI SUSCEPTIBILITIES TO FOLLOW Performed at [facility], [address] [place], [place] [address]    Report Status PENDING  Incomplete  Respiratory Panel by PCR     Status: None   Collection Time: 02/29/16  4:55 PM  Result Value Ref Range Status   Adenovirus NOT DETECTED NOT DETECTED Final   Coronavirus 229E NOT DETECTED NOT DETECTED Final   Coronavirus HKU1 NOT DETECTED NOT DETECTED Final   Coronavirus NL63 NOT DETECTED NOT DETECTED Final   Coronavirus OC43 NOT DETECTED NOT DETECTED Final   Metapneumovirus NOT DETECTED NOT DETECTED Final   Rhinovirus / Enterovirus NOT DETECTED NOT DETECTED Final   Influenza A NOT DETECTED NOT DETECTED Final   Influenza B NOT DETECTED NOT DETECTED Final  Parainfluenza Virus 1 NOT DETECTED NOT DETECTED Final   Parainfluenza Virus 2 NOT DETECTED NOT DETECTED Final   Parainfluenza Virus 3 NOT DETECTED NOT DETECTED Final   Parainfluenza Virus 4 NOT DETECTED NOT DETECTED Final   Respiratory Syncytial Virus NOT DETECTED NOT DETECTED Final   Bordetella pertussis NOT DETECTED NOT DETECTED Final   Chlamydophila pneumoniae NOT DETECTED NOT DETECTED Final   Mycoplasma pneumoniae NOT DETECTED NOT DETECTED Final    Comment: Performed at [facility], [address] [place], [place] [address]     Scheduled Meds: . calcium carbonate  1,250 mg Oral BID WC  . cefTRIAXone (ROCEPHIN)  IV  1 g Intravenous Q24H  . insulin aspart  0-5 Units Subcutaneous QHS  . insulin aspart  0-9 Units Subcutaneous TID WC  . iopamidol      .  linagliptin  5 mg Oral Daily  . pantoprazole (PROTONIX) IV  40 mg Intravenous Q12H  . simvastatin  40 mg Oral QPM   Continuous Infusions: . sodium chloride 75 mL/hr at 03/01/16 2120  . sodium chloride      Procedures/Studies: No results found.  Adelie Croswell, DO  [facility] Pager [phone]  If 7PM-7AM, please contact night-coverage [email or web address] Password [email or web address] 03/02/2016, 3:16 PM   LOS: 1 day

## 2016-03-02 NOTE — Progress Notes (Addendum)
  Subjective:  Patient has no complaints. She has not had a BM in last 24 hours. Her appetite is good. She denies abdominal pain.  Objective: Blood pressure (!) 141/75, pulse 82, temperature 97.6 F (36.4 C), temperature source Oral, resp. rate 18, height 5\' 2"  (1.575 m), weight 177 lb 12.8 oz (80.6 kg), SpO2 99 %. Patient is alert and does not appear to be pale like yesterday. Abdomen is full with low midline scar. Large hernia noted involving upper part of the scar and extending lead to the left site. It is firm and nontender. Organomegaly or masses. No LE edema or clubbing noted.  Labs/studies Results:   Recent Labs  02/29/16 1113 03/01/16 0536 03/01/16 2305  WBC 9.5 5.5  --   HGB 9.1* 7.7* 9.1*  HCT 27.5* 23.5* 27.1*  PLT 290 246  --     BMET   Recent Labs  02/29/16 1113 03/01/16 0536  NA 132* 139  K 5.2* 5.1  CL 100* 112*  CO2 22 22  GLUCOSE 188* 118*  BUN 73* 52*  CREATININE 2.42* 1.52*  CALCIUM 8.8* 8.6*    LFT   Recent Labs  02/29/16 1113  PROT 7.3  ALBUMIN 3.4*  AST 19  ALT 9*  ALKPHOS 56  BILITOT 0.4    PT/INR   Recent Labs  02/29/16 1801  LABPROT 13.8  INR 1.06      Assessment:  #1. Melena and anemia. She has received 1 unit of PRBC and hemoglobin is up to 9.1 g. No evidence of active bleeding. For EGD under monitored anesthesia care tomorrow. #2. Abnormal abdominal exam with large ventral hernia. Yesterday had noted this area to be full. She has history of ventral hernia containing omentum. It feels fairly firm. Will reevaluated with CT without contrast. #3. Azotemia secondary to dehydration. Creatinine yesterday was in 0.25. #4. Cognitive impairment She appears to be at her baseline.   Recommendations:  Abdominopelvic CT with oral contrast only. Discontinue enteric precautions. EBC and metabolic 7 AM. EGD under monitored anesthesia care tomorrow.

## 2016-03-03 ENCOUNTER — Encounter (HOSPITAL_COMMUNITY)
Admission: EM | Disposition: A | Discharge status: Home / Self Care | Payer: Self-pay | Source: Home / Self Care | Attending: Internal Medicine

## 2016-03-03 DIAGNOSIS — E871 Hypo-osmolality and hyponatremia: Secondary | ICD-10-CM

## 2016-03-03 DIAGNOSIS — K449 Diaphragmatic hernia without obstruction or gangrene: Secondary | ICD-10-CM

## 2016-03-03 DIAGNOSIS — K210 Gastro-esophageal reflux disease with esophagitis: Secondary | ICD-10-CM

## 2016-03-03 DIAGNOSIS — K3189 Other diseases of stomach and duodenum: Secondary | ICD-10-CM

## 2016-03-03 DIAGNOSIS — K228 Other specified diseases of esophagus: Secondary | ICD-10-CM

## 2016-03-03 HISTORY — PX: ESOPHAGOGASTRODUODENOSCOPY: SHX5428

## 2016-03-03 LAB — URINE CULTURE: Culture: 80000 — AB

## 2016-03-03 LAB — GLUCOSE, CAPILLARY
GLUCOSE-CAPILLARY: 138 mg/dL — AB (ref 65–99)
GLUCOSE-CAPILLARY: 131 mg/dL — AB (ref 65–99)
Glucose-Capillary: 123 mg/dL — ABNORMAL HIGH (ref 65–99)
Glucose-Capillary: 192 mg/dL — ABNORMAL HIGH (ref 65–99)
Glucose-Capillary: 100 mg/dL — ABNORMAL HIGH (ref 65–99)
Glucose-Capillary: 133 mg/dL — ABNORMAL HIGH (ref 65–99)

## 2016-03-03 LAB — CBC
HCT: 30.6 % — ABNORMAL LOW (ref 36.0–46.0)
HEMOGLOBIN: 10 g/dL — AB (ref 12.0–15.0)
MCH: 28.9 pg (ref 26.0–34.0)
MCHC: 32.7 g/dL (ref 30.0–36.0)
MCV: 88.4 fL (ref 78.0–100.0)
Platelets: 258 10*3/uL (ref 150–400)
RBC: 3.46 MIL/uL — ABNORMAL LOW (ref 3.87–5.11)
RDW: 13.4 % (ref 11.5–15.5)
WBC: 7 10*3/uL (ref 4.0–10.5)

## 2016-03-03 LAB — BASIC METABOLIC PANEL
Anion gap: 9 (ref 5–15)
BUN: 25 mg/dL — ABNORMAL HIGH (ref 6–20)
CALCIUM: 8.9 mg/dL (ref 8.9–10.3)
CO2: 23 mmol/L (ref 22–32)
Chloride: 105 mmol/L (ref 101–111)
Creatinine, Ser: 1.34 mg/dL — ABNORMAL HIGH (ref 0.44–1.00)
GFR calc non Af Amer: 38 mL/min — ABNORMAL LOW (ref >60)
GFR, EST AFRICAN AMERICAN: 44 mL/min — AB (ref >60)
Glucose, Bld: 164 mg/dL — ABNORMAL HIGH (ref 65–99)
Potassium: 4.9 mmol/L (ref 3.5–5.1)
SODIUM: 137 mmol/L (ref 135–145)

## 2016-03-03 LAB — HEMOGLOBIN AND HEMATOCRIT, BLOOD
HEMATOCRIT: 28.4 % — AB (ref 36.0–46.0)
HEMOGLOBIN: 9.1 g/dL — AB (ref 12.0–15.0)

## 2016-03-03 SURGERY — EGD (ESOPHAGOGASTRODUODENOSCOPY)
Anesthesia: Moderate Sedation

## 2016-03-03 MED ORDER — SODIUM CHLORIDE 0.9 % IV SOLN
INTRAVENOUS | Status: DC | PRN
  Administered 2016-03-03: 500 mL via INTRAVENOUS

## 2016-03-03 MED ORDER — PANTOPRAZOLE SODIUM 40 MG PO TBEC
40.0000 mg | DELAYED_RELEASE_TABLET | Freq: Two times a day (BID) | ORAL | Status: DC
  Administered 2016-03-03 – 2016-03-04 (×3): 40 mg via ORAL
  Filled 2016-03-03 (×3): qty 1

## 2016-03-03 MED ORDER — STERILE WATER FOR IRRIGATION IR SOLN
Status: DC | PRN
  Administered 2016-03-03: 100 mL

## 2016-03-03 MED ORDER — BUTAMBEN-TETRACAINE-BENZOCAINE 2-2-14 % EX AERO
INHALATION_SPRAY | CUTANEOUS | Status: DC | PRN
  Administered 2016-03-03: 2 via TOPICAL

## 2016-03-03 MED ORDER — CEFDINIR 125 MG/5ML PO SUSR: 300.0000 mg | Freq: Two times a day (BID) | ORAL | Status: DC

## 2016-03-03 MED ORDER — PEG 3350-KCL-NA BICARB-NACL 420 G PO SOLR
4000.0000 mL | Freq: Once | ORAL | Status: AC
  Administered 2016-03-03: 4000 mL via ORAL
  Filled 2016-03-03: qty 4000

## 2016-03-03 MED ORDER — MIDAZOLAM HCL 5 MG/5ML IJ SOLN
INTRAMUSCULAR | Status: AC
  Filled 2016-03-03: qty 10

## 2016-03-03 MED ORDER — MEPERIDINE HCL 50 MG/ML IJ SOLN
INTRAMUSCULAR | Status: DC | PRN
  Administered 2016-03-03: 25 mg via INTRAVENOUS

## 2016-03-03 MED ORDER — ONDANSETRON HCL 4 MG/2ML IJ SOLN
4.0000 mg | Freq: Once | INTRAMUSCULAR | Status: AC
  Administered 2016-03-03: 4 mg via INTRAVENOUS
  Filled 2016-03-03: qty 2

## 2016-03-03 MED ORDER — MEPERIDINE HCL 50 MG/ML IJ SOLN
INTRAMUSCULAR | Status: AC
  Filled 2016-03-03: qty 1

## 2016-03-03 MED ORDER — CEFUROXIME AXETIL 250 MG PO TABS
500.0000 mg | ORAL_TABLET | Freq: Two times a day (BID) | ORAL | Status: DC
  Administered 2016-03-04 (×2): 500 mg via ORAL
  Filled 2016-03-03 (×2): qty 2

## 2016-03-03 MED ORDER — LORAZEPAM 2 MG/ML IJ SOLN
0.5000 mg | Freq: Once | INTRAMUSCULAR | Status: AC
  Administered 2016-03-03: 0.5 mg via INTRAVENOUS
  Filled 2016-03-03: qty 1

## 2016-03-03 MED ORDER — MIDAZOLAM HCL 5 MG/5ML IJ SOLN
INTRAMUSCULAR | Status: DC | PRN
  Administered 2016-03-03 (×2): 1 mg via INTRAVENOUS
  Administered 2016-03-03: 2 mg via INTRAVENOUS

## 2016-03-03 NOTE — Progress Notes (Signed)
   EGD findings:  Large sliding hiatal hernia with mild changes of reflux esophagitis at GE junction. Antral scar consistent with healed ulcer. Antral gastritis. Biopsy taken. No lesion found to account for patient's GI bleed.   Will plan colonoscopy tomorrow if patient agreeable. Mrs. Dorrene GermanJeffryes, her POA will try to convince her.

## 2016-03-03 NOTE — Evaluation (Signed)
Physical Therapy Evaluation Patient Details Name: [name] MRN: [id number] DOB: [birth year] Today's Date: 03/03/2016   History of Present Illness  [name] is a 75yo white person identifying as female who comes to [facility] on 2/2 after 2 falls at home; upon arrival. Pt has been experiencing loose stooles, melenotic stools, cough, sore throat, and HA for 2-3 days PTA. PMH: DM, cogntiive impairment, HTN, HLD, old CVa.    Clinical Impression  Pt admitted with above diagnosis. Pt currently with functional limitations due to the deficits listed below (see "PT Problem List"). Upon entry, the patient is received semirecumbent in bed, no family/caregiver present to assist with history. Pt wil cognitive impairment at baseline, and today patient somewhat reliable, but requires frequent redirection to get answers regarding baseline function. The pt is awake and agreeable to participate. No acute distress noted at this time, pt only reporting some mild, nonconcerning pain. HR response to activity is WNL, and pt denies dizziness with positional changes. The pt is alert and oriented x1, pleasant, conversational, and following simple and multi-step commands consistently. Functional mobility assessment demonstrates mild weakness, however the patient reports to be mobilizing close to her baseline level of function. She AMB slowly, and at 100' reports to feel somewhat tired, and slows herself for energy conservation. The patient reports no falls history prior to this recent episode.   Pt will benefit from skilled PT intervention to increase independence and safety with basic mobility in preparation for discharge to the venue listed below.       Follow Up Recommendations Home health PT;Supervision - Intermittent    Equipment Recommendations  None recommended by PT    Recommendations for Other Services       Precautions / Restrictions Precautions Precautions: None      Mobility  Bed Mobility Overal  bed mobility: Needs Assistance Bed Mobility: Supine to Sit;Sit to Supine     Supine to sit: Supervision Sit to supine: Supervision      Transfers Overall transfer level: Needs assistance   Transfers: Sit to/from Stand Sit to Stand: Supervision            Ambulation/Gait Ambulation/Gait assistance: Min guard Ambulation Distance (Feet): 200 Feet Assistive device: None       General Gait Details: denies dizziness, reports some fatigue halfway, slow AMB, but appears steady.   Stairs            Wheelchair Mobility    Modified Rankin (Stroke Patients Only)       Balance Overall balance assessment: History of Falls;Modified Independent                                           Pertinent Vitals/Pain Pain Assessment: No/denies pain    Home Living Family/patient expects to be discharged to:: Private residence Living Arrangements: Non-relatives/Friends Available Help at Discharge:  (Aid: 5days weekly; roommate) Type of Home: Mobile home Home Access: Stairs to enter   Secretary/administrator of Steps: 1 Home Layout: One level Home Equipment: None      Prior Function           Comments: requires assistance for IADL; independent in ADL/self care (sometimes needs help in bathroom     Hand Dominance        Extremity/Trunk Assessment        Lower Extremity Assessment Lower Extremity Assessment: Generalized weakness  Communication   Communication: No difficulties  Cognition Arousal/Alertness: Awake/alert Behavior During Therapy: WFL for tasks assessed/performed;Impulsive Overall Cognitive Status: History of cognitive impairments - at baseline                      General Comments      Exercises     Assessment/Plan    PT Assessment Patient needs continued PT services  PT Problem List Decreased strength;Decreased balance;Decreased mobility;Decreased coordination;Decreased activity  tolerance;Obesity;Decreased safety awareness          PT Treatment Interventions Gait training;Functional mobility training;Therapeutic activities;Therapeutic exercise;Balance training;Patient/family education    PT Goals (Current goals can be found in the Care Plan section)  Acute Rehab PT Goals PT Goal Formulation: Patient unable to participate in goal setting    Frequency Min 2X/week   Barriers to discharge Decreased caregiver support      Co-evaluation               End of Session Equipment Utilized During Treatment: Gait belt Activity Tolerance: Patient tolerated treatment well;Patient limited by fatigue Patient left: in bed;with call bell/phone within reach;with bed alarm set Nurse Communication: Mobility status         Time: [id number] PT Time Calculation (min) (ACUTE ONLY): 13 min   Charges:   PT Evaluation $PT Eval Moderate Complexity: 1 Procedure PT Treatments $Therapeutic Activity: 8-22 mins   PT G Codes:       11:37 AM, 03/03/16 [name], PT, DPT Physical Therapist - [place] [phone](ASCOM)  [phone] (mobile)

## 2016-03-03 NOTE — Discharge Summary (Signed)
Physician Discharge Summary  [name] ZOX:[id number] DOB: July 21, 1940 DOA: 02/29/2016  PCP: Alice Reichert, MD  Admit date: 02/29/2016 Discharge date: 03/04/16  Admitted From: Home Disposition:  Home   Recommendations for Outpatient Follow-up:  1. Follow up with PCP in 1-2 weeks 2. Please obtain BMP/CBC in one week   Home Health:Yes Equipment/Devices HHPT  Discharge Condition: Stable CODE STATUS:FULL Diet recommendation: Heart Healthy    Brief/Interim Summary: 76 y.o.femalewith medical history of diabetes mellitus, cognitive impairment, hypertension, hyperlipidemia presented after having 2 near syncopal episodes on the day of admission. The patient has a history of cognitive impairment, and she is a poor historian. Apparently, the patient was getting up from a seated position at her table when she lost her balance and fell to the floor. Apparently after some time, she was found by her caretaker awake on the floor. When she tried to stand up again, the patient was weak and fell again without loss of consciousness. It is unclear whether the patient truly lost consciousness on her first fall. Nevertheless, the patient denies any loss of consciousness. The patient has been complaining of loose stools/diarrhea for the past 2 days with associated headache and subjective fevers. In addition, the patient states that she has seen some black stool. GI was consulted to assist in management.  Discharge Diagnoses:  Acute kidney injury -Secondary to volume depletion/dehydration -Continue IV fluids-->improving -d/c lisinopril altogether -d/c HCTZ and lasix due to high risk of recurrent volume depletion and poor po intake--no hx of CHF -Baseline creatinine 1.0-1.1 -serum creatinine 1.12 on day of d/c  Near syncope -Likely due to volume depletion although there may be a component of symptomatically anemia -Check orthostatic vital signs--marginally positive with HR increase by 17 beats  from seated to standing -PT evaluation-->HHPT -place on tele--noconcerning dysrhythmias-->d/c tele  Symptomatically anemia/melena -Presentinghemoglobin 9.1-->7.7-->transfuse one unit PRBC -Hgb remained stable after transfusion -Previous hemoglobin 11.6 on 09/28/2013 -Patient has never had any endoscopy or colonoscopy  -Given her melanotic stools and positive Hemoccult, consultedgastroenterology  -03/03/16--EGD--antral gastritis, antral scar consistent with healed ulcer, mild changes of reflux esophagitis. -03/02/2016 CT abdomen--stable large periumbilical ventral hernia; Perifissural 5 mm left lower lobe pulmonary nodule -03/04/16--plans for colonoscopy--pt refused  Influenza-like illness -Influenza PCR--neg -Respiratory virus panel--neg -resolved  Diarrhea -Stool pathogen panel--cancelled -no BM since admission-->D/C enteric precautions  UTI--Ecoli -02/29/16 UA--TNTC WBC -followurine culture-->Ecoli -empiric ceftriaxone-->cefuroxime x 3 more days to complete 7 days tx  Hypertension -Holding HCTZ due to soft BP-->improved -although BP became elevated later in the hospitalization, it remained acceptable (systolic 140-150) and decision was made to avoid overly aggressive BP control in setting of near syncope  Diabetes mellitus type 2 -Holding glipizide and metformin--restart after discharge -NovoLog sliding scale -Hemoglobin A1c-7.1  Hyponatremia -Secondary to volume depletion -Continue IV fluids-->improved  Atypical chest pain -personally reviewed EKG--sinus with nonspecific T-wave change -likely from fall -cycle troponins--negative  Cognitive impairment -I have asked her POA, Ms. Doristine Mango bring in a formal paperwork to document that she is indeed the patient's HPOA--paperwork confirmed she is the POA  LLL Pulmonary Nodule -incidental finding on CT -outpt surveillance CT  Discharge Instructions  Discharge Instructions    Diet - low sodium heart  healthy    Complete by:  As directed    Increase activity slowly    Complete by:  As directed      Allergies as of 03/04/2016      Reactions   Penicillins Rash   Has patient had a PCN reaction  causing immediate rash, facial/tongue/throat swelling, SOB or lightheadedness with hypotension: No Has patient had a PCN reaction causing severe rash involving mucus membranes or skin necrosis: No Has patient had a PCN reaction that required hospitalization No Has patient had a PCN reaction occurring within the last 10 years: No If all of the above answers are "NO", then may proceed with Cephalosporin use.      Medication List    STOP taking these medications   furosemide 20 MG tablet Commonly known as:  LASIX   hydrochlorothiazide 25 MG tablet Commonly known as:  HYDRODIURIL   lisinopril 20 MG tablet Commonly known as:  PRINIVIL,ZESTRIL     TAKE these medications   calcium carbonate 600 MG Tabs tablet Commonly known as:  OS-CAL Take 600 mg by mouth 2 (two) times daily with a meal.   cefUROXime 500 MG tablet Commonly known as:  CEFTIN Take 1 tablet (500 mg total) by mouth 2 (two) times daily with a meal.   escitalopram 10 MG tablet Commonly known as:  LEXAPRO Take 10 mg by mouth daily.   gabapentin 300 MG capsule Commonly known as:  NEURONTIN Take 300 mg by mouth 3 (three) times daily.   glipiZIDE-metformin 5-500 MG tablet Commonly known as:  METAGLIP Take 1 tablet by mouth 2 (two) times daily before a meal.   LORazepam 0.5 MG tablet Commonly known as:  ATIVAN Take 0.5 mg by mouth every 4 (four) hours as needed for anxiety.   MYRBETRIQ 25 MG Tb24 tablet Generic drug:  mirabegron ER Take 25 mg by mouth daily.   simvastatin 40 MG tablet Commonly known as:  ZOCOR Take 40 mg by mouth every evening.      Follow-up Information    [facility] Follow up.   Contact information: [address] [place] [id number] [phone]           Allergies  Allergen Reactions  . Penicillins Rash    Has patient had a PCN reaction causing immediate rash, facial/tongue/throat swelling, SOB or lightheadedness with hypotension: No Has patient had a PCN reaction causing severe rash involving mucus membranes or skin necrosis: No Has patient had a PCN reaction that required hospitalization No Has patient had a PCN reaction occurring within the last 10 years: No If all of the above answers are "NO", then may proceed with Cephalosporin use.      Consultations:  GI   Procedures/Studies: Ct Abdomen Pelvis Wo Contrast  Result Date: 03/02/2016 CLINICAL DATA:  Reported history is abdominal mass. Patient reports history of ventral hernia, recent falls and diarrhea. Prior hysterectomy. EXAM: CT ABDOMEN AND PELVIS WITHOUT CONTRAST TECHNIQUE: Multidetector CT imaging of the abdomen and pelvis was performed following the standard protocol without IV contrast. COMPARISON:  06/21/2012 CT abdomen/pelvis. FINDINGS: Partially motion degraded scan. Lower chest: Subpleural 5 mm perifissural anterior left lower lobe solid pulmonary nodule (series 4/ image 5). Mild compressive atelectasis in the medial basilar left lower lobe. Left anterior descending, left circumflex and right coronary atherosclerosis. Hepatobiliary: Normal liver with no liver mass. Cholecystectomy . No biliary ductal dilatation. Pancreas: Normal, with no mass or duct dilation. Spleen: Normal size. No mass. Adrenals/Urinary Tract: Stable calcifications in the left greater than right adrenal glands without discrete adrenal nodules. No hydronephrosis. No renal stones. Calcifications in the renal sinus bilaterally are favored to represent vascular calcifications. No contour deforming renal masses . Relatively collapsed and grossly normal bladder. Stomach/Bowel: Moderate hiatal hernia. Otherwise grossly normal nondistended stomach. Normal  caliber small bowel with no small bowel wall thickening.  Appendix is not discretely visualized. No pericecal inflammatory changes. There is a large umbilical hernia containing fat and a portion of the mid transverse colon, overall stable in size since 06/21/2012. Marked sigmoid diverticulosis. No large bowel wall thickening, pericolonic fat stranding or pneumatosis. Moderate stool throughout the colon. No significant colonic distention. Vascular/Lymphatic: Atherosclerotic nonaneurysmal abdominal aorta. No pathologically enlarged lymph nodes in the abdomen or pelvis. Reproductive: Status post hysterectomy, with no abnormal findings at the vaginal cuff. No adnexal mass. Other: No pneumoperitoneum, ascites or focal fluid collection. Musculoskeletal: No aggressive appearing focal osseous lesions. Marked thoracolumbar spondylosis. IMPRESSION: 1. Large periumbilical ventral hernia, overall stable in size since 2014, although now containing a portion of the mid transverse colon. No CT evidence of bowel obstruction, ischemia or perforation. 2. Perifissural 5 mm left lower lobe pulmonary nodule, probably benign. No follow-up needed if patient is low-risk. Non-contrast chest CT can be considered in 12 months if patient is high-risk. This recommendation follows the consensus statement: Guidelines for Management of Incidental Pulmonary Nodules Detected on CT Images: From the Fleischner Society 2017; Radiology 2017; 284:228-243. 3. Additional findings include moderate hiatal hernia, aortic atherosclerosis, three-vessel coronary atherosclerosis and marked sigmoid diverticulosis. Electronically Signed   By: Delbert Phenix M.D.   On: 03/02/2016 17:04        Discharge Exam: Vitals:   03/04/16 0525 03/04/16 1418  BP: (!) 155/84 (!) 144/84  Pulse: 96 84  Resp: 20 18  Temp: 98.4 F (36.9 C) 98.2 F (36.8 C)   Vitals:   03/03/16 0956 03/03/16 2106 03/04/16 0525 03/04/16 1418  BP: (!) 147/87 (!) 143/73 (!) 155/84 (!) 144/84  Pulse: 83 73 96 84  Resp: 18 20 20 18   Temp: 98.6  F (37 C) 98 F (36.7 C) 98.4 F (36.9 C) 98.2 F (36.8 C)  TempSrc: Oral Axillary Oral Oral  SpO2: 99% 96% 98% 97%  Weight:      Height:        General: Pt is alert, awake, not in acute distress Cardiovascular: RRR, S1/S2 +, no rubs, no gallops Respiratory: CTA bilaterally, no wheezing, no rhonchi Abdominal: Soft, NT, ND, bowel sounds + Extremities: no edema, no cyanosis   The results of significant diagnostics from this hospitalization (including imaging, microbiology, ancillary and laboratory) are listed below for reference.    Significant Diagnostic Studies: Ct Abdomen Pelvis Wo Contrast  Result Date: 03/02/2016 CLINICAL DATA:  Reported history is abdominal mass. Patient reports history of ventral hernia, recent falls and diarrhea. Prior hysterectomy. EXAM: CT ABDOMEN AND PELVIS WITHOUT CONTRAST TECHNIQUE: Multidetector CT imaging of the abdomen and pelvis was performed following the standard protocol without IV contrast. COMPARISON:  06/21/2012 CT abdomen/pelvis. FINDINGS: Partially motion degraded scan. Lower chest: Subpleural 5 mm perifissural anterior left lower lobe solid pulmonary nodule (series 4/ image 5). Mild compressive atelectasis in the medial basilar left lower lobe. Left anterior descending, left circumflex and right coronary atherosclerosis. Hepatobiliary: Normal liver with no liver mass. Cholecystectomy . No biliary ductal dilatation. Pancreas: Normal, with no mass or duct dilation. Spleen: Normal size. No mass. Adrenals/Urinary Tract: Stable calcifications in the left greater than right adrenal glands without discrete adrenal nodules. No hydronephrosis. No renal stones. Calcifications in the renal sinus bilaterally are favored to represent vascular calcifications. No contour deforming renal masses . Relatively collapsed and grossly normal bladder. Stomach/Bowel: Moderate hiatal hernia. Otherwise grossly normal nondistended stomach. Normal caliber small bowel with no small  bowel wall thickening. Appendix is not discretely visualized. No pericecal inflammatory changes. There is a large umbilical hernia containing fat and a portion of the mid transverse colon, overall stable in size since 06/21/2012. Marked sigmoid diverticulosis. No large bowel wall thickening, pericolonic fat stranding or pneumatosis. Moderate stool throughout the colon. No significant colonic distention. Vascular/Lymphatic: Atherosclerotic nonaneurysmal abdominal aorta. No pathologically enlarged lymph nodes in the abdomen or pelvis. Reproductive: Status post hysterectomy, with no abnormal findings at the vaginal cuff. No adnexal mass. Other: No pneumoperitoneum, ascites or focal fluid collection. Musculoskeletal: No aggressive appearing focal osseous lesions. Marked thoracolumbar spondylosis. IMPRESSION: 1. Large periumbilical ventral hernia, overall stable in size since 2014, although now containing a portion of the mid transverse colon. No CT evidence of bowel obstruction, ischemia or perforation. 2. Perifissural 5 mm left lower lobe pulmonary nodule, probably benign. No follow-up needed if patient is low-risk. Non-contrast chest CT can be considered in 12 months if patient is high-risk. This recommendation follows the consensus statement: Guidelines for Management of Incidental Pulmonary Nodules Detected on CT Images: From the Fleischner Society 2017; Radiology 2017; 284:228-243. 3. Additional findings include moderate hiatal hernia, aortic atherosclerosis, three-vessel coronary atherosclerosis and marked sigmoid diverticulosis. Electronically Signed   By: Delbert Phenix M.D.   On: 03/02/2016 17:04     Microbiology: Recent Results (from the past 240 hour(s))  Culture, Urine     Status: Abnormal   Collection Time: 02/29/16 11:03 AM  Result Value Ref Range Status   Specimen Description URINE, RANDOM  Final   Special Requests NONE  Final   Culture 80,000 COLONIES/mL ESCHERICHIA COLI (A)  Final   Report  Status 03/03/2016 FINAL  Final   Organism ID, Bacteria ESCHERICHIA COLI (A)  Final      Susceptibility   Escherichia coli - MIC*    AMPICILLIN >=32 RESISTANT Resistant     CEFAZOLIN <=4 SENSITIVE Sensitive     CEFTRIAXONE <=1 SENSITIVE Sensitive     CIPROFLOXACIN >=4 RESISTANT Resistant     GENTAMICIN <=1 SENSITIVE Sensitive     IMIPENEM <=0.25 SENSITIVE Sensitive     NITROFURANTOIN <=16 SENSITIVE Sensitive     TRIMETH/SULFA <=20 SENSITIVE Sensitive     AMPICILLIN/SULBACTAM 16 INTERMEDIATE Intermediate     PIP/TAZO <=4 SENSITIVE Sensitive     Extended ESBL NEGATIVE Sensitive     * 80,000 COLONIES/mL ESCHERICHIA COLI  Respiratory Panel by PCR     Status: None   Collection Time: 02/29/16  4:55 PM  Result Value Ref Range Status   Adenovirus NOT DETECTED NOT DETECTED Final   Coronavirus 229E NOT DETECTED NOT DETECTED Final   Coronavirus HKU1 NOT DETECTED NOT DETECTED Final   Coronavirus NL63 NOT DETECTED NOT DETECTED Final   Coronavirus OC43 NOT DETECTED NOT DETECTED Final   Metapneumovirus NOT DETECTED NOT DETECTED Final   Rhinovirus / Enterovirus NOT DETECTED NOT DETECTED Final   Influenza A NOT DETECTED NOT DETECTED Final   Influenza B NOT DETECTED NOT DETECTED Final   Parainfluenza Virus 1 NOT DETECTED NOT DETECTED Final   Parainfluenza Virus 2 NOT DETECTED NOT DETECTED Final   Parainfluenza Virus 3 NOT DETECTED NOT DETECTED Final   Parainfluenza Virus 4 NOT DETECTED NOT DETECTED Final   Respiratory Syncytial Virus NOT DETECTED NOT DETECTED Final   Bordetella pertussis NOT DETECTED NOT DETECTED Final   Chlamydophila pneumoniae NOT DETECTED NOT DETECTED Final   Mycoplasma pneumoniae NOT DETECTED NOT DETECTED Final    Comment: Performed at [facility], [address] [place], [place] [address]     Labs: Basic Metabolic Panel:  Recent Labs Lab 02/29/16 1113 03/01/16 0536 03/02/16 1509 03/03/16 0551 03/04/16 0613  NA 132* 139 135 137 138  K 5.2* 5.1 5.6* 4.9 4.6   CL 100* 112* 106 105 108  CO2 22 22 24 23 26   GLUCOSE 188* 118* 143* 164* 131*  BUN 73* 52* 24* 25* 15  CREATININE 2.42* 1.52* 1.51* 1.34* 1.12*  CALCIUM 8.8* 8.6* 8.7* 8.9 8.7*  MG  --   --   --   --  1.3*   Liver Function Tests:  Recent Labs Lab 02/29/16 1113  AST 19  ALT 9*  ALKPHOS 56  BILITOT 0.4  PROT 7.3  ALBUMIN 3.4*   No results for input(s): LIPASE, AMYLASE in the last 168 hours. No results for input(s): AMMONIA in the last 168 hours. CBC:  Recent Labs Lab 02/29/16 1113 03/01/16 0536 03/01/16 2305 03/03/16 0551 03/03/16 1040 03/04/16 0613  WBC 9.5 5.5  --  7.0  --   --   NEUTROABS 6.7  --   --   --   --   --   HGB 9.1* 7.7* 9.1* 10.0* 9.1* 9.7*  HCT 27.5* 23.5* 27.1* 30.6* 28.4* 29.4*  MCV 87.0 87.7  --  88.4  --   --   PLT 290 246  --  258  --   --    Cardiac Enzymes:  Recent Labs Lab 02/29/16 1801 03/01/16 0536  TROPONINI <0.03 <0.03   BNP: Invalid input(s): POCBNP CBG:  Recent Labs Lab 03/03/16 1238 03/03/16 1633 03/03/16 2102 03/04/16 0737 03/04/16 1129  GLUCAP 192* 100* 133* 137* 165*    Time coordinating discharge:  Greater than 30 minutes  Signed:  Ricke Kimoto, DO [facility] Pager: [phone] 03/04/2016, 3:13 PM

## 2016-03-03 NOTE — Op Note (Signed)
[facility] Patient Name: [name] Procedure Date: 03/03/2016 8:33 AM MRN: [id number] Date of Birth: 1940-05-13 Attending MD: Lionel December , MD CSN: [id number] Age: 76 Admit Type: Outpatient Procedure:                Upper GI endoscopy Indications:              Acute post hemorrhagic anemia, Melena Providers:                Lionel December, MD, Edrick Kins, RN, Lollie Marrow.                            [place], Pensions consultant Referring MD:             Catarina Hartshorn, MD Medicines:                Cetacaine spray, Meperidine 25 mg IV, Midazolam 4                            mg IV Complications:            No immediate complications. Estimated Blood Loss:     Estimated blood loss was minimal. Procedure:                Pre-Anesthesia Assessment:                           - Prior to the procedure, a History and Physical                            was performed, and patient medications and                            allergies were reviewed. The patient's tolerance of                            previous anesthesia was also reviewed. The risks                            and benefits of the procedure and the sedation                            options and risks were discussed with the patient.                            All questions were answered, and informed consent                            was obtained. Prior Anticoagulants: The patient                            last took previous NSAID medication 4 days prior to                            the procedure. ASA Grade Assessment: II - A patient  with mild systemic disease. After reviewing the                            risks and benefits, the patient was deemed in                            satisfactory condition to undergo the procedure.                           After obtaining informed consent, the endoscope was                            passed under direct vision. Throughout the                            procedure,  the patient's blood pressure, pulse, and                            oxygen saturations were monitored continuously. The                            EG-[id number] ([id number]) scope was introduced through the                            mouth, and advanced to the second part of duodenum.                            The upper GI endoscopy was accomplished without                            difficulty. The patient tolerated the procedure                            well. Scope In: 8:37:56 AM Scope Out: 8:48:59 AM Total Procedure Duration: 0 hours 11 minutes 3 seconds  Findings:      The examined esophagus was normal.      The Z-line was irregular and was found 34 cm from the incisors.      Focal edema and erythema esophagitis was found 34 cm from the incisors.      A 7 cm hiatal hernia was present.      A healed ulcer was found on the lesser curvature of the gastric antrum.      A few erosions were found in the gastric antrum and in the prepyloric       region of the stomach. Biopsies were taken with a cold forceps for       histology.      The exam of the stomach was otherwise normal.      The duodenal bulb and second portion of the duodenum were normal. Impression:               - Normal esophagus.                           - Z-line irregular, 34 cm from the incisors.                           -  Mild changes of reflux esophagitis at GEJ.                           - 7 cm hiatal hernia.                           - Scar in the gastric antrum (lesser curvature).                           - Erosive gastropathy. Biopsied.                           - Normal duodenal bulb and second portion of the                            duodenum. Moderate Sedation:      Moderate (conscious) sedation was administered by the endoscopy nurse       and supervised by the endoscopist. The following parameters were       monitored: oxygen saturation, heart rate, blood pressure, CO2       capnography and response to care.  Total physician intraservice time was       16 minutes. Recommendation:           - Return patient to hospital ward for ongoing care.                           - Clear liquid diet today.                           - Continue present medications.                           - Await pathology results.                           - Change PPI to oral route                           - Perform a colonoscopy tomorrow if patient would                            agree. Procedure Code(s):        --- Professional ---                           [id number], Esophagogastroduodenoscopy, flexible,                            transoral; with biopsy, single or multiple                           99152, Moderate sedation services provided by the                            same physician or other qualified health care  professional performing the diagnostic or                            therapeutic service that the sedation supports,                            requiring the presence of an independent trained                            observer to assist in the monitoring of the                            patient's level of consciousness and physiological                            status; initial 15 minutes of intraservice time,                            patient age 55 years or older Diagnosis Code(s):        --- Professional ---                           K22.8, Other specified diseases of esophagus                           K21.0, Gastro-esophageal reflux disease with                            esophagitis                           K44.9, Diaphragmatic hernia without obstruction or                            gangrene                           K31.89, Other diseases of stomach and duodenum                           D62, Acute posthemorrhagic anemia                           K92.1, Melena (includes Hematochezia) CPT copyright 2016 American Medical Association. All rights reserved. The codes  documented in this report are preliminary and upon coder review may  be revised to meet current compliance requirements. Lionel December, MD Lionel December, MD 03/03/2016 9:03:05 AM This report has been signed electronically. Number of Addenda: 0

## 2016-03-03 NOTE — Care Management Note (Signed)
Case Management Note  Patient Details  Name: [name] MRN: [id number] Date of Birth: 08/08/1940  Subjective/Objective:                  Pt is from home, lives with roommate and has friend who provides support. Pt uses walker with mobility. She has aid PTA but her aid is temporarily not working. PT has recommended HH PT. Pt is agreeable. Nursing, PT and aid will be ordered. [facility]. Alroy BailiffLinda Lothian, [facility] is aware and will obtain pt info from chart.   Action/Plan: [facility]. CM will follow [facility]   Expected Discharge Date:      03/04/2016            Expected Discharge Plan:  Home w Home Health Services  In-House Referral:  NA  Discharge planning Services  CM Consult  Post Acute Care Choice:  Home Health Choice offered to:  Patient  HH Arranged:  RN, PT, Nurse's Aide HH Agency:  [facility]  Status of Service:  In process, will continue to follow  Malcolm MetroChildress, Dodd Schmid Demske, RN 03/03/2016, 3:02 PM

## 2016-03-03 NOTE — Progress Notes (Signed)
PROGRESS NOTE  [name] MVH:[id number] DOB: 05-26-1940 DOA: 02/29/2016 PCP: Alice Reichert, MD  Brief History:  76 y.o.femalewith medical history of diabetes mellitus, cognitive impairment, hypertension, hyperlipidemia presented after having 2 near syncopal episodes on the day of admission. The patient has a history of cognitive impairment, and she is a poor historian. Apparently, the patient was getting up from a seated position at her table when she lost her balance and fell to the floor. Apparently after some time, she was found by her caretaker awake on the floor. When she tried to stand up again, the patient was weak and fell again without loss of consciousness. It is unclear whether the patient truly lost consciousness on her first fall. Nevertheless, the patient denies any loss of consciousness. The patient has been complaining of loose stools/diarrhea for the past 2 days with associated headache and subjective fevers. In addition, the patient states that she has seen some black stool. GI was consulted to assist in management.  Assessment/Plan: Acute kidney injury -Secondary to volume depletion/dehydration -Continue IV fluids-->improving -Baseline creatinine 1.0-1.1 -am BMP  Near syncope -Likely due to volume depletion although there may be a component of symptomatically anemia -Check orthostatic vital signs--marginally positive with HR increase by 17 beats from seated to standing -PT evaluation-->HHPT -place on tele--noconcerning dysrhythmias-->d/c tele  Symptomatically anemia/melena -Presentinghemoglobin 9.1-->7.7-->transfuse one unit PRBC -transfused one unit PRBC 03/01/16-->Hgb 9.1 -Previous hemoglobin 11.6 on 09/28/2013 -Patient has never had any endoscopy or colonoscopy  -Given her melanotic stools and positive Hemoccult, consulted gastroenterology  -03/03/16--EGD--antral gastritis, antral scar consistent with healed ulcer, mild changes of reflux  esophagitis. -03/02/2016 CT abdomen--stable large periumbilical ventral hernia; Perifissural 5 mm left lower lobe pulmonary nodule -03/04/16--plans for colonoscopy  Influenza-like illness -Influenza PCR--neg -Respiratory virus panel--neg  Diarrhea -Stool pathogen panel -no BM since admission-->D/C enteric precautions  UTI--Ecoli -02/29/16 UA--TNTC WBC -followurine culture-->Ecoli -empiric ceftriaxone-->cefuroxime  Hypertension -Holding HCTZ due to soft BP-->improved  Diabetes mellitus type 2 -Holding glipizide and metformin -NovoLog sliding scale -Hemoglobin A1c-7.1  Hyponatremia -Secondary to volume depletion -Continue IV fluids-->improved  Atypical chest pain -personally reviewed EKG--sinus with nonspecific T-wave change -likely from fall -cycle troponins--negative  Cognitive impairment -I'm concerned that the patient will not be able to go home to her previous living arrangement -I have asked her POA, Ms. Geraldo Pitter bring in a formal paperwork to document that she is indeed the patient's HPOA    Disposition Plan: Home 03/05/15 if colonoscopy unremarkable Family Communication: POA updated 03/02/16  Consultants: GI  Code Status: FULL   DVT Prophylaxis: SCDs   Procedures: As Listed in Progress Note Above  Antibiotics: None          Subjective: Patient denies fevers, chills, headache, chest pain, dyspnea, nausea, vomiting, diarrhea, abdominal pain, dysuria, hematuria, hematochezia, and melena. Patient is feeling stronger. She does not like the prep for the colonoscopy.   Objective: Vitals:   03/03/16 0905 03/03/16 0910 03/03/16 0915 03/03/16 0956  BP: (!) 166/86 (!) 153/82  (!) 147/87  Pulse: 79 79 72 83  Resp: 17 18 17 18   Temp:    98.6 F (37 C)  TempSrc:    Oral  SpO2: 99% 99% 99% 99%  Weight:      Height:        Intake/Output Summary (Last 24 hours) at 03/03/16 1528 Last data filed at 03/03/16 1422  Gross  per 24 hour  Intake  640 ml  Output             1900 ml  Net            -1260 ml   Weight change:  Exam:   General:  Pt is alert, follows commands appropriately, not in acute distress  HEENT: No icterus, No thrush, No neck mass, [place]/AT  Cardiovascular: RRR, S1/S2, no rubs, no gallops  Respiratory: Left basilar crackles. No wheezing. Good air movement.  Abdomen: Soft/+BS, non tender, non distended, no guarding  Extremities: 1+ LE edema, No lymphangitis, No petechiae, No rashes, no synovitis   Data Reviewed: I have personally reviewed following labs and imaging studies Basic Metabolic Panel:  Recent Labs Lab 02/29/16 1113 03/01/16 0536 03/02/16 1509 03/03/16 0551  NA 132* 139 135 137  K 5.2* 5.1 5.6* 4.9  CL 100* 112* 106 105  CO2 22 22 24 23   GLUCOSE 188* 118* 143* 164*  BUN 73* 52* 24* 25*  CREATININE 2.42* 1.52* 1.51* 1.34*  CALCIUM 8.8* 8.6* 8.7* 8.9   Liver Function Tests:  Recent Labs Lab 02/29/16 1113  AST 19  ALT 9*  ALKPHOS 56  BILITOT 0.4  PROT 7.3  ALBUMIN 3.4*   No results for input(s): LIPASE, AMYLASE in the last 168 hours. No results for input(s): AMMONIA in the last 168 hours. Coagulation Profile:  Recent Labs Lab 02/29/16 1801  INR 1.06   CBC:  Recent Labs Lab 02/29/16 1113 03/01/16 0536 03/01/16 2305 03/03/16 0551 03/03/16 1040  WBC 9.5 5.5  --  7.0  --   NEUTROABS 6.7  --   --   --   --   HGB 9.1* 7.7* 9.1* 10.0* 9.1*  HCT 27.5* 23.5* 27.1* 30.6* 28.4*  MCV 87.0 87.7  --  88.4  --   PLT 290 246  --  258  --    Cardiac Enzymes:  Recent Labs Lab 02/29/16 1801 03/01/16 0536  TROPONINI <0.03 <0.03   BNP: Invalid input(s): POCBNP CBG:  Recent Labs Lab 03/02/16 1632 03/02/16 2152 03/03/16 0803 03/03/16 1004 03/03/16 1238  GLUCAP 123* 136* 138* 131* 192*   HbA1C:  Recent Labs  02/29/16 1812  HGBA1C 7.1*   Urine analysis:    Component Value Date/Time   COLORURINE YELLOW 02/29/2016 1103    APPEARANCEUR HAZY (A) 02/29/2016 1103   LABSPEC 1.009 02/29/2016 1103   PHURINE 5.0 02/29/2016 1103   GLUCOSEU NEGATIVE 02/29/2016 1103   HGBUR SMALL (A) 02/29/2016 1103   BILIRUBINUR NEGATIVE 02/29/2016 1103   KETONESUR NEGATIVE 02/29/2016 1103   PROTEINUR NEGATIVE 02/29/2016 1103   UROBILINOGEN 0.2 09/27/2013 1300   NITRITE NEGATIVE 02/29/2016 1103   LEUKOCYTESUR LARGE (A) 02/29/2016 1103   Sepsis Labs: @LABRCNTIP (procalcitonin:4,lacticidven:4) ) Recent Results (from the past 240 hour(s))  Culture, Urine     Status: Abnormal   Collection Time: 02/29/16 11:03 AM  Result Value Ref Range Status   Specimen Description URINE, RANDOM  Final   Special Requests NONE  Final   Culture 80,000 COLONIES/mL ESCHERICHIA COLI (A)  Final   Report Status 03/03/2016 FINAL  Final   Organism ID, Bacteria ESCHERICHIA COLI (A)  Final      Susceptibility   Escherichia coli - MIC*    AMPICILLIN >=32 RESISTANT Resistant     CEFAZOLIN <=4 SENSITIVE Sensitive     CEFTRIAXONE <=1 SENSITIVE Sensitive     CIPROFLOXACIN >=4 RESISTANT Resistant     GENTAMICIN <=1 SENSITIVE Sensitive     IMIPENEM <=0.25 SENSITIVE Sensitive  NITROFURANTOIN <=16 SENSITIVE Sensitive     TRIMETH/SULFA <=20 SENSITIVE Sensitive     AMPICILLIN/SULBACTAM 16 INTERMEDIATE Intermediate     PIP/TAZO <=4 SENSITIVE Sensitive     Extended ESBL NEGATIVE Sensitive     * 80,000 COLONIES/mL ESCHERICHIA COLI  Respiratory Panel by PCR     Status: None   Collection Time: 02/29/16  4:55 PM  Result Value Ref Range Status   Adenovirus NOT DETECTED NOT DETECTED Final   Coronavirus 229E NOT DETECTED NOT DETECTED Final   Coronavirus HKU1 NOT DETECTED NOT DETECTED Final   Coronavirus NL63 NOT DETECTED NOT DETECTED Final   Coronavirus OC43 NOT DETECTED NOT DETECTED Final   Metapneumovirus NOT DETECTED NOT DETECTED Final   Rhinovirus / Enterovirus NOT DETECTED NOT DETECTED Final   Influenza A NOT DETECTED NOT DETECTED Final   Influenza B NOT  DETECTED NOT DETECTED Final   Parainfluenza Virus 1 NOT DETECTED NOT DETECTED Final   Parainfluenza Virus 2 NOT DETECTED NOT DETECTED Final   Parainfluenza Virus 3 NOT DETECTED NOT DETECTED Final   Parainfluenza Virus 4 NOT DETECTED NOT DETECTED Final   Respiratory Syncytial Virus NOT DETECTED NOT DETECTED Final   Bordetella pertussis NOT DETECTED NOT DETECTED Final   Chlamydophila pneumoniae NOT DETECTED NOT DETECTED Final   Mycoplasma pneumoniae NOT DETECTED NOT DETECTED Final    Comment: Performed at [facility], [address] [place], [place] [address]     Scheduled Meds: . calcium carbonate  1,250 mg Oral BID WC  . cefTRIAXone (ROCEPHIN)  IV  1 g Intravenous Q24H  . insulin aspart  0-5 Units Subcutaneous QHS  . insulin aspart  0-9 Units Subcutaneous TID WC  . linagliptin  5 mg Oral Daily  . pantoprazole  40 mg Oral BID AC  . simvastatin  40 mg Oral QPM   Continuous Infusions: . sodium chloride 75 mL/hr at 03/01/16 2120    Procedures/Studies: Ct Abdomen Pelvis Wo Contrast  Result Date: 03/02/2016 CLINICAL DATA:  Reported history is abdominal mass. Patient reports history of ventral hernia, recent falls and diarrhea. Prior hysterectomy. EXAM: CT ABDOMEN AND PELVIS WITHOUT CONTRAST TECHNIQUE: Multidetector CT imaging of the abdomen and pelvis was performed following the standard protocol without IV contrast. COMPARISON:  06/21/2012 CT abdomen/pelvis. FINDINGS: Partially motion degraded scan. Lower chest: Subpleural 5 mm perifissural anterior left lower lobe solid pulmonary nodule (series 4/ image 5). Mild compressive atelectasis in the medial basilar left lower lobe. Left anterior descending, left circumflex and right coronary atherosclerosis. Hepatobiliary: Normal liver with no liver mass. Cholecystectomy . No biliary ductal dilatation. Pancreas: Normal, with no mass or duct dilation. Spleen: Normal size. No mass. Adrenals/Urinary Tract: Stable calcifications in the left  greater than right adrenal glands without discrete adrenal nodules. No hydronephrosis. No renal stones. Calcifications in the renal sinus bilaterally are favored to represent vascular calcifications. No contour deforming renal masses . Relatively collapsed and grossly normal bladder. Stomach/Bowel: Moderate hiatal hernia. Otherwise grossly normal nondistended stomach. Normal caliber small bowel with no small bowel wall thickening. Appendix is not discretely visualized. No pericecal inflammatory changes. There is a large umbilical hernia containing fat and a portion of the mid transverse colon, overall stable in size since 06/21/2012. Marked sigmoid diverticulosis. No large bowel wall thickening, pericolonic fat stranding or pneumatosis. Moderate stool throughout the colon. No significant colonic distention. Vascular/Lymphatic: Atherosclerotic nonaneurysmal abdominal aorta. No pathologically enlarged lymph nodes in the abdomen or pelvis. Reproductive: Status post hysterectomy, with no abnormal findings at the  vaginal cuff. No adnexal mass. Other: No pneumoperitoneum, ascites or focal fluid collection. Musculoskeletal: No aggressive appearing focal osseous lesions. Marked thoracolumbar spondylosis. IMPRESSION: 1. Large periumbilical ventral hernia, overall stable in size since 2014, although now containing a portion of the mid transverse colon. No CT evidence of bowel obstruction, ischemia or perforation. 2. Perifissural 5 mm left lower lobe pulmonary nodule, probably benign. No follow-up needed if patient is low-risk. Non-contrast chest CT can be considered in 12 months if patient is high-risk. This recommendation follows the consensus statement: Guidelines for Management of Incidental Pulmonary Nodules Detected on CT Images: From the Fleischner Society 2017; Radiology 2017; 284:228-243. 3. Additional findings include moderate hiatal hernia, aortic atherosclerosis, three-vessel coronary atherosclerosis and marked  sigmoid diverticulosis. Electronically Signed   By: Delbert Phenix M.D.   On: 03/02/2016 17:04    Demone Lyles, DO  [facility] Pager [phone]  If 7PM-7AM, please contact night-coverage [email or web address] Password [email or web address] 03/03/2016, 3:28 PM   LOS: 2 days

## 2016-03-04 ENCOUNTER — Encounter (HOSPITAL_COMMUNITY)
Admission: EM | Disposition: A | Discharge status: Home / Self Care | Payer: Self-pay | Source: Home / Self Care | Attending: Internal Medicine

## 2016-03-04 LAB — BASIC METABOLIC PANEL
Anion gap: 4 — ABNORMAL LOW (ref 5–15)
BUN: 15 mg/dL (ref 6–20)
CHLORIDE: 108 mmol/L (ref 101–111)
CO2: 26 mmol/L (ref 22–32)
CREATININE: 1.12 mg/dL — AB (ref 0.44–1.00)
Calcium: 8.7 mg/dL — ABNORMAL LOW (ref 8.9–10.3)
GFR calc Af Amer: 55 mL/min — ABNORMAL LOW (ref >60)
GFR calc non Af Amer: 48 mL/min — ABNORMAL LOW (ref >60)
Glucose, Bld: 131 mg/dL — ABNORMAL HIGH (ref 65–99)
Potassium: 4.6 mmol/L (ref 3.5–5.1)
Sodium: 138 mmol/L (ref 135–145)

## 2016-03-04 LAB — GLUCOSE, CAPILLARY
GLUCOSE-CAPILLARY: 137 mg/dL — AB (ref 65–99)
Glucose-Capillary: 165 mg/dL — ABNORMAL HIGH (ref 65–99)
Glucose-Capillary: 187 mg/dL — ABNORMAL HIGH (ref 65–99)

## 2016-03-04 LAB — HEMOGLOBIN AND HEMATOCRIT, BLOOD
HEMATOCRIT: 29.4 % — AB (ref 36.0–46.0)
HEMOGLOBIN: 9.7 g/dL — AB (ref 12.0–15.0)

## 2016-03-04 LAB — MAGNESIUM: MAGNESIUM: 1.3 mg/dL — AB (ref 1.7–2.4)

## 2016-03-04 SURGERY — COLONOSCOPY
Anesthesia: Moderate Sedation

## 2016-03-04 MED ORDER — CEFUROXIME AXETIL 500 MG PO TABS: 500.0000 mg | ORAL_TABLET | Freq: Two times a day (BID) | ORAL | 0 refills | Status: DC

## 2016-03-04 MED ORDER — MAGNESIUM SULFATE 4 GM/100ML IV SOLN
4.0000 g | Freq: Once | INTRAVENOUS | Status: AC
  Administered 2016-03-04: 4 g via INTRAVENOUS
  Filled 2016-03-04: qty 100

## 2016-03-04 NOTE — Progress Notes (Signed)
Patient refused to drink prep for colonoscopy. She has decided not to proceed with colonoscopy. No rectal bleeding or melena reported according to nursing staff. H&H yesterday afternoon was 9.1. Gastric biopsy pending. Abdominal exam reveals soft abdomen with large ventral hernia in lower abdomen. Check H&H today.

## 2016-03-04 NOTE — Progress Notes (Signed)
Spoke with Toniann FailWendy who is listed as the contact person and asked if someone could pick up the patient around 6 pm due to her receiving iv medications.  She stated that she was going to call Nicholos JohnsKathline because she is suppose to be the one picking the patient up.  I verbalized understanding and asked her to have Kathline to call me.

## 2016-03-04 NOTE — Progress Notes (Signed)
Patient discharged with instructions, prescription, and care notes.  Verbalized understanding via teach back.  IV was removed and the site was WNL. Patient voiced no further complaints or concerns at the time of discharge.  Appointments scheduled per instructions.  Patient left the floor via w/c family  And staff in stable condition. 

## 2016-03-04 NOTE — Care Management Note (Signed)
Case Management Note  Patient Details  Name: Cassell SmilesBrenda S Dohn MRN: [id number] Date of Birth: March 31, 1940   Expected Discharge Date:  03/04/16               Expected Discharge Plan:  Home w Home Health Services  In-House Referral:  NA  Discharge planning Services  CM Consult  Post Acute Care Choice:  Home Health Choice offered to:  Patient  HH Arranged:  RN, PT, Nurse's Aide HH Agency:  [facility]  Status of Service:  Completed, signed off   Additional Comments: [facility]. Alroy BailiffLinda Lothian, of [facility] aware of DC. Pt aware HH has 48hrs to make first visit. No DME needs.  Malcolm Metrohildress, Kevona Lupinacci Demske, RN 03/04/2016, 3:59 PM

## 2016-03-06 ENCOUNTER — Encounter (HOSPITAL_COMMUNITY): Payer: Self-pay | Admitting: Internal Medicine

## 2016-06-18 ENCOUNTER — Inpatient Hospital Stay (HOSPITAL_COMMUNITY)
Admission: EM | Admit: 2016-06-18 | Discharge: 2016-06-23 | DRG: 377 | Disposition: A | Discharge status: Home / Self Care | Payer: Medicare Other | Attending: Family Medicine | Admitting: Family Medicine

## 2016-06-18 ENCOUNTER — Encounter (HOSPITAL_COMMUNITY): Payer: Self-pay | Admitting: *Deleted

## 2016-06-18 DIAGNOSIS — K254 Chronic or unspecified gastric ulcer with hemorrhage: Secondary | ICD-10-CM | POA: Diagnosis not present

## 2016-06-18 DIAGNOSIS — J690 Pneumonitis due to inhalation of food and vomit: Secondary | ICD-10-CM | POA: Diagnosis present

## 2016-06-18 DIAGNOSIS — R509 Fever, unspecified: Secondary | ICD-10-CM

## 2016-06-18 DIAGNOSIS — D62 Acute posthemorrhagic anemia: Secondary | ICD-10-CM | POA: Diagnosis present

## 2016-06-18 DIAGNOSIS — F17210 Nicotine dependence, cigarettes, uncomplicated: Secondary | ICD-10-CM | POA: Diagnosis present

## 2016-06-18 DIAGNOSIS — D500 Iron deficiency anemia secondary to blood loss (chronic): Secondary | ICD-10-CM | POA: Diagnosis not present

## 2016-06-18 DIAGNOSIS — N183 Chronic kidney disease, stage 3 unspecified: Secondary | ICD-10-CM | POA: Diagnosis present

## 2016-06-18 DIAGNOSIS — E1122 Type 2 diabetes mellitus with diabetic chronic kidney disease: Secondary | ICD-10-CM | POA: Diagnosis present

## 2016-06-18 DIAGNOSIS — K922 Gastrointestinal hemorrhage, unspecified: Secondary | ICD-10-CM | POA: Diagnosis not present

## 2016-06-18 DIAGNOSIS — Z7984 Long term (current) use of oral hypoglycemic drugs: Secondary | ICD-10-CM

## 2016-06-18 DIAGNOSIS — D509 Iron deficiency anemia, unspecified: Secondary | ICD-10-CM | POA: Diagnosis present

## 2016-06-18 DIAGNOSIS — I129 Hypertensive chronic kidney disease with stage 1 through stage 4 chronic kidney disease, or unspecified chronic kidney disease: Secondary | ICD-10-CM | POA: Diagnosis present

## 2016-06-18 DIAGNOSIS — E119 Type 2 diabetes mellitus without complications: Secondary | ICD-10-CM

## 2016-06-18 DIAGNOSIS — E7800 Pure hypercholesterolemia, unspecified: Secondary | ICD-10-CM | POA: Diagnosis present

## 2016-06-18 DIAGNOSIS — K921 Melena: Principal | ICD-10-CM | POA: Diagnosis present

## 2016-06-18 DIAGNOSIS — K259 Gastric ulcer, unspecified as acute or chronic, without hemorrhage or perforation: Secondary | ICD-10-CM | POA: Diagnosis present

## 2016-06-18 DIAGNOSIS — K449 Diaphragmatic hernia without obstruction or gangrene: Secondary | ICD-10-CM | POA: Diagnosis present

## 2016-06-18 DIAGNOSIS — K5730 Diverticulosis of large intestine without perforation or abscess without bleeding: Secondary | ICD-10-CM | POA: Diagnosis present

## 2016-06-18 DIAGNOSIS — Z79899 Other long term (current) drug therapy: Secondary | ICD-10-CM | POA: Diagnosis not present

## 2016-06-18 DIAGNOSIS — Z90710 Acquired absence of both cervix and uterus: Secondary | ICD-10-CM | POA: Diagnosis not present

## 2016-06-18 DIAGNOSIS — R42 Dizziness and giddiness: Secondary | ICD-10-CM | POA: Diagnosis present

## 2016-06-18 DIAGNOSIS — K219 Gastro-esophageal reflux disease without esophagitis: Secondary | ICD-10-CM | POA: Diagnosis present

## 2016-06-18 DIAGNOSIS — N179 Acute kidney failure, unspecified: Secondary | ICD-10-CM | POA: Diagnosis present

## 2016-06-18 DIAGNOSIS — K644 Residual hemorrhoidal skin tags: Secondary | ICD-10-CM | POA: Diagnosis present

## 2016-06-18 DIAGNOSIS — R0602 Shortness of breath: Secondary | ICD-10-CM

## 2016-06-18 DIAGNOSIS — Z880 Allergy status to penicillin: Secondary | ICD-10-CM

## 2016-06-18 DIAGNOSIS — I10 Essential (primary) hypertension: Secondary | ICD-10-CM | POA: Diagnosis present

## 2016-06-18 DIAGNOSIS — D649 Anemia, unspecified: Secondary | ICD-10-CM | POA: Diagnosis present

## 2016-06-18 LAB — CBC WITH DIFFERENTIAL/PLATELET
BASOS ABS: 0 10*3/uL (ref 0.0–0.1)
Basophils Relative: 0 %
Eosinophils Absolute: 0.3 10*3/uL (ref 0.0–0.7)
Eosinophils Relative: 4 %
HCT: 22.6 % — ABNORMAL LOW (ref 36.0–46.0)
Hemoglobin: 6.9 g/dL — CL (ref 12.0–15.0)
LYMPHS PCT: 27 %
Lymphs Abs: 2 10*3/uL (ref 0.7–4.0)
MCH: 25.3 pg — AB (ref 26.0–34.0)
MCHC: 30.5 g/dL (ref 30.0–36.0)
MCV: 82.8 fL (ref 78.0–100.0)
MONO ABS: 0.5 10*3/uL (ref 0.1–1.0)
Monocytes Relative: 7 %
Neutro Abs: 4.5 10*3/uL (ref 1.7–7.7)
Neutrophils Relative %: 61 %
Platelets: 268 10*3/uL (ref 150–400)
RBC: 2.73 MIL/uL — ABNORMAL LOW (ref 3.87–5.11)
RDW: 16.4 % — AB (ref 11.5–15.5)
WBC: 7.3 10*3/uL (ref 4.0–10.5)

## 2016-06-18 LAB — GLUCOSE, CAPILLARY
GLUCOSE-CAPILLARY: 82 mg/dL (ref 65–99)
Glucose-Capillary: 101 mg/dL — ABNORMAL HIGH (ref 65–99)

## 2016-06-18 LAB — COMPREHENSIVE METABOLIC PANEL
ALBUMIN: 3.2 g/dL — AB (ref 3.5–5.0)
ALT: 10 U/L — ABNORMAL LOW (ref 14–54)
AST: 18 U/L (ref 15–41)
Alkaline Phosphatase: 62 U/L (ref 38–126)
Anion gap: 9 (ref 5–15)
BILIRUBIN TOTAL: 0.2 mg/dL — AB (ref 0.3–1.2)
BUN: 24 mg/dL — AB (ref 6–20)
CALCIUM: 8.6 mg/dL — AB (ref 8.9–10.3)
CO2: 25 mmol/L (ref 22–32)
Chloride: 106 mmol/L (ref 101–111)
Creatinine, Ser: 1.42 mg/dL — ABNORMAL HIGH (ref 0.44–1.00)
GFR calc Af Amer: 41 mL/min — ABNORMAL LOW (ref >60)
GFR calc non Af Amer: 36 mL/min — ABNORMAL LOW (ref >60)
Glucose, Bld: 74 mg/dL (ref 65–99)
POTASSIUM: 4.7 mmol/L (ref 3.5–5.1)
Sodium: 140 mmol/L (ref 135–145)
TOTAL PROTEIN: 7.1 g/dL (ref 6.5–8.1)

## 2016-06-18 LAB — PREPARE RBC (CROSSMATCH)

## 2016-06-18 MED ORDER — POLYETHYLENE GLYCOL 3350 17 G PO PACK: 17.0000 g | PACK | Freq: Every day | ORAL | Status: DC | PRN

## 2016-06-18 MED ORDER — SODIUM CHLORIDE 0.9 % IV SOLN: 250.0000 mL | INTRAVENOUS | Status: DC | PRN

## 2016-06-18 MED ORDER — ONDANSETRON HCL 4 MG/2ML IJ SOLN
4.0000 mg | Freq: Four times a day (QID) | INTRAMUSCULAR | Status: DC | PRN
  Administered 2016-06-20: 4 mg via INTRAVENOUS
  Filled 2016-06-18: qty 2

## 2016-06-18 MED ORDER — ALBUTEROL SULFATE (2.5 MG/3ML) 0.083% IN NEBU: 2.5000 mg | INHALATION_SOLUTION | RESPIRATORY_TRACT | Status: DC | PRN

## 2016-06-18 MED ORDER — SENNA 8.6 MG PO TABS
1.0000 | ORAL_TABLET | Freq: Two times a day (BID) | ORAL | Status: DC
  Administered 2016-06-18 – 2016-06-23 (×9): 8.6 mg via ORAL
  Filled 2016-06-18 (×10): qty 1

## 2016-06-18 MED ORDER — INSULIN ASPART 100 UNIT/ML ~~LOC~~ SOLN: 1.0000 [IU] | Freq: Two times a day (BID) | SUBCUTANEOUS | Status: DC | PRN

## 2016-06-18 MED ORDER — TRAZODONE HCL 50 MG PO TABS: 50.0000 mg | ORAL_TABLET | Freq: Every evening | ORAL | Status: DC | PRN

## 2016-06-18 MED ORDER — SODIUM CHLORIDE 0.9 % IV SOLN
Freq: Once | INTRAVENOUS | Status: AC
  Administered 2016-06-18: 17:00:00 via INTRAVENOUS

## 2016-06-18 MED ORDER — ACETAMINOPHEN 650 MG RE SUPP
650.0000 mg | Freq: Four times a day (QID) | RECTAL | Status: DC | PRN
  Administered 2016-06-20: 650 mg via RECTAL
  Filled 2016-06-18: qty 1

## 2016-06-18 MED ORDER — ONDANSETRON HCL 4 MG PO TABS: 4.0000 mg | ORAL_TABLET | Freq: Four times a day (QID) | ORAL | Status: DC | PRN

## 2016-06-18 MED ORDER — PANTOPRAZOLE SODIUM 40 MG IV SOLR
40.0000 mg | Freq: Two times a day (BID) | INTRAVENOUS | Status: DC
  Administered 2016-06-18 – 2016-06-20 (×4): 40 mg via INTRAVENOUS
  Filled 2016-06-18 (×4): qty 40

## 2016-06-18 MED ORDER — ACETAMINOPHEN 325 MG PO TABS
650.0000 mg | ORAL_TABLET | Freq: Four times a day (QID) | ORAL | Status: DC | PRN
  Administered 2016-06-21: 650 mg via ORAL
  Filled 2016-06-18: qty 2

## 2016-06-18 MED ORDER — SODIUM CHLORIDE 0.9% FLUSH: 3.0000 mL | INTRAVENOUS | Status: DC | PRN

## 2016-06-18 MED ORDER — SODIUM CHLORIDE 0.9% FLUSH
3.0000 mL | Freq: Two times a day (BID) | INTRAVENOUS | Status: DC
  Administered 2016-06-18 – 2016-06-22 (×8): 3 mL via INTRAVENOUS

## 2016-06-18 MED ORDER — FUROSEMIDE 10 MG/ML IJ SOLN
40.0000 mg | Freq: Once | INTRAMUSCULAR | Status: AC
  Administered 2016-06-18: 40 mg via INTRAVENOUS
  Filled 2016-06-18: qty 4

## 2016-06-18 MED ORDER — ONDANSETRON HCL 4 MG/2ML IJ SOLN: 4.0000 mg | Freq: Four times a day (QID) | INTRAMUSCULAR | Status: DC | PRN

## 2016-06-18 NOTE — H&P (Signed)
Patient Demographics:    [name], is a 76 y.o. female  MRN: [id number]   DOB - 1940-06-11  Admit Date - 06/18/2016  Outpatient Primary MD for the patient is Pearson Grippe, MD   Assessment & Plan:    Principal Problem:   GI (gastrointestinal bleed) Active Problems:   HTN (hypertension)   Diabetes (HCC)   Anemia   Anemia due to acute blood loss   CKD (chronic kidney disease), stage III    1)Symptomatic Anemia- suspect acute blood loss anemia, hemoglobin is below 7 patient has significant fatigue and dizziness. Previously admitted in February 2018 with syncope due to anemia, okay to type and cross and transfuse 2 units of packed cells with Lasix in between. Patient had EGD on 03/03/2016 with findings of erosive gastritis and GERD type symptoms, patient declined colonoscopy at that time. GI consult from Dr. Duncan Dull fields pending, hopefully patient we agreed to colonoscopy at this time around  2)DM- Allow some permissive Hyperglycemia rather than risk life-threatening hypoglycemia in a patient with unreliable oral intake due to acute GI bleed. Hold glipizide/metformin. Use Novolog/Humalog Sliding scale insulin with Accu-Cheks/Fingersticks as ordered  3)CKD stage III- renal function appears to be at baseline at this time, avoid nephrotoxic agents  With History of - Reviewed by me  Past Medical History:  Diagnosis Date  . Diabetes mellitus without complication (HCC)   . Hypercholesterolemia   . Hypertension       Past Surgical History:  Procedure Laterality Date  . ABDOMINAL HYSTERECTOMY    . ESOPHAGOGASTRODUODENOSCOPY N/A 03/03/2016   Procedure: ESOPHAGOGASTRODUODENOSCOPY (EGD);  Surgeon: Malissa Hippo, MD;  Location: [facility];  Service: Endoscopy;  Laterality: N/A;  . FRACTURE SURGERY     Chief  Complaint  Patient presents with  . Abnormal Lab      HPI:    [name]  is a 76 y.o. female past medical history relevant for CK D and anemia as well as diabetes who was sent over to the ED by PCP due to fatigue and hemoglobin below 7.  In ED patient is found to have a hemoglobin of 6.9, she endorses fatigue and dizziness, please note that in February 2018 patient was admitted to the hospital for syncope due to anemia at that time colonoscopy was advised the patient declined. Her EGD on 03/03/2016 showed some degree of erosive gastritis and GERD.   Patient complains of on some abdominal discomfort, no vomiting or diarrhea,   Patient declined rectal exam  Denies hematochezia, stool was Hemoccult-positive   Review of systems:    In addition to the HPI above,   A full 12 point Review of 10 Systems was done, except as stated above, all other Review of 10 Systems were negative.    Social History:  Reviewed by me   Social History  Substance Use Topics  . Smoking status: Current Some Day Smoker    Packs/day: 0.00  Types: Cigarettes  . Smokeless tobacco: Never Used     Comment: 2 cigarettes daily   . Alcohol use No     Family History :  Reviewed by me   No family history on file. Unable to obtain as patient is a poor historian   Home Medications:   Prior to Admission medications   Medication Sig Start Date End Date Taking? Authorizing Provider  calcium carbonate (OS-CAL) 600 MG TABS Take 600 mg by mouth 2 (two) times daily with a meal.   Yes [provider]  cefUROXime (CEFTIN) 500 MG tablet Take 1 tablet (500 mg total) by mouth 2 (two) times daily with a meal. Patient not taking: Reported on 06/18/2016 03/04/16   Catarina Hartshornat, David, MD  escitalopram (LEXAPRO) 10 MG tablet Take 10 mg by mouth daily.    [provider]  gabapentin (NEURONTIN) 300 MG capsule Take 300 mg by mouth 3 (three) times daily.    [provider]  glipiZIDE-metformin (METAGLIP)  5-500 MG per tablet Take 1 tablet by mouth 2 (two) times daily before a meal.    [provider]  LORazepam (ATIVAN) 0.5 MG tablet Take 0.5 mg by mouth every 4 (four) hours as needed for anxiety.    [provider]  mirabegron ER (MYRBETRIQ) 25 MG TB24 tablet Take 25 mg by mouth daily.    [provider]  simvastatin (ZOCOR) 40 MG tablet Take 40 mg by mouth every evening.    [provider]     Allergies:     Allergies  Allergen Reactions  . Penicillins Rash    Has patient had a PCN reaction causing immediate rash, facial/tongue/throat swelling, SOB or lightheadedness with hypotension: No Has patient had a PCN reaction causing severe rash involving mucus membranes or skin necrosis: No Has patient had a PCN reaction that required hospitalization No Has patient had a PCN reaction occurring within the last 10 years: No If all of the above answers are "NO", then may proceed with Cephalosporin use.      Physical Exam:   Vitals  Blood pressure (!) (P) 141/59, pulse (P) 82, temperature (P) 99 F (37.2 C), temperature source (P) Oral, resp. rate (P) 20, height 5\' 4"  (1.626 m), weight 80.3 kg (177 lb), SpO2 (P) 97 %.  Physical Examination: General appearance - alert, well appearing, and in no distress and  Mental status - alert, oriented to person,At times forgetful  Eyes - sclera anicteric Neck - supple, no JVD elevation , Chest - clear  to auscultation bilaterally, symmetrical air movement,  Heart - S1 and S2 normal,  Abdomen - soft, nontender, nondistended, no masses or organomegaly Neurological -  generalized weakness without new focal deficits Extremities -  intact peripheral pulses  Skin - warm, dry. pale    Data Review:    CBC  Recent Labs Lab 06/18/16 1240  WBC 7.3  HGB 6.9*  HCT 22.6*  PLT 268  MCV 82.8  MCH 25.3*  MCHC 30.5  RDW 16.4*  LYMPHSABS 2.0  MONOABS 0.5  EOSABS 0.3  BASOSABS 0.0    ------------------------------------------------------------------------------------------------------------------ Chemistries   Recent Labs Lab 06/18/16 1240  NA 140  K 4.7  CL 106  CO2 25  GLUCOSE 74  BUN 24*  CREATININE 1.42*  CALCIUM 8.6*  AST 18  ALT 10*  ALKPHOS 62  BILITOT 0.2*   ------------------------------------------------------------------------------------------------------------------ estimated creatinine clearance is 36.2 mL/min (A) (by C-G formula based on SCr of 1.42 mg/dL (H)). ------------------------------------------------------------------------------------------------------------------ No results  for input(s): TSH, T4TOTAL, T3FREE, THYROIDAB in the last 72 hours.  Invalid input(s): FREET3   Coagulation profile No results for input(s): INR, PROTIME in the last 168 hours. ------------------------------------------------------------------------------------------------------------------- No results for input(s): DDIMER in the last 72 hours. -------------------------------------------------------------------------------------------------------------------  Cardiac Enzymes No results for input(s): CKMB, TROPONINI, MYOGLOBIN in the last 168 hours.  Invalid input(s): CK ------------------------------------------------------------------------------------------------------------------ No results found for: BNP  ---------------------------------------------------------------------------------------------------------------  Urinalysis    Component Value Date/Time   COLORURINE YELLOW 02/29/2016 1103   APPEARANCEUR HAZY (A) 02/29/2016 1103   LABSPEC 1.009 02/29/2016 1103   PHURINE 5.0 02/29/2016 1103   GLUCOSEU NEGATIVE 02/29/2016 1103   HGBUR SMALL (A) 02/29/2016 1103   BILIRUBINUR NEGATIVE 02/29/2016 1103   KETONESUR NEGATIVE 02/29/2016 1103   PROTEINUR NEGATIVE 02/29/2016 1103   UROBILINOGEN 0.2 09/27/2013 1300   NITRITE NEGATIVE 02/29/2016 1103    LEUKOCYTESUR LARGE (A) 02/29/2016 1103   ----------------------------------------------------------------------------------------------------------------   Imaging Results:    No results found.  Radiological Exams on Admission: No results found.  DVT Prophylaxis -SCD   AM Labs Ordered, also please review Full Orders  Family Communication: Admission, patients condition and plan of care including tests being ordered have been discussed with the patient who indicate understanding and agree with the plan   Code Status - Full Code  Likely DC to  TBD  Condition   stable  Bula Cavalieri M.D on 06/18/2016 at 5:18 PM  Between 7am to 7pm - Pager - [phone]  After 7pm go to [email or web address] - password [email or web address]  [facility] - Office  [phone]  Voice Recognition Reubin Milan dictation system was used to create this note, attempts have been made to correct errors. Please contact the author with questions and/or clarifications.

## 2016-06-18 NOTE — ED Notes (Signed)
Blood bank states blood is not ready at this time

## 2016-06-18 NOTE — ED Notes (Signed)
Date and time results received: 06/18/16  Test: hgb Critical Value: 6.9  Name of Provider Notified: pickering   Orders Received? No

## 2016-06-18 NOTE — ED Provider Notes (Addendum)
[facility] Provider Note   CSN: [id number] Arrival date & time: 06/18/16  1200     History   Chief Complaint Chief Complaint  Patient presents with  . Abnormal Lab    HPI [name] is a 76 y.o. female.  HPI Patient presents with reported hemoglobin of 6. Had seen her primary care doctor for some fatigue. Has been dizzy at times. No blood in the stool or black stool. Has had some right-sided abdominal pain. States that's been going on for years. No fevers or chills. No bleeding. Reportedly glomus 6 was told to come to the ER. Patient refuses rectal exam.   Past Medical History:  Diagnosis Date  . Diabetes mellitus without complication (HCC)   . Hypercholesterolemia   . Hypertension     Patient Active Problem List   Diagnosis Date Noted  . Hyponatremia 03/02/2016  . AKI (acute kidney injury) (HCC) 02/29/2016  . Uncontrolled type 2 diabetes mellitus with hyperglycemia, without long-term current use of insulin (HCC) 02/29/2016  . Influenza-like illness 02/29/2016  . Hypernatremia 02/29/2016  . Anemia   . Dehydration   . Hypotension   . Near syncope   . UTI (urinary tract infection) 09/27/2013  . HTN (hypertension) 09/27/2013  . Diabetes (HCC) 09/27/2013  . Dysphasia 09/27/2013  . Altered mental status 09/27/2013    Past Surgical History:  Procedure Laterality Date  . ABDOMINAL HYSTERECTOMY    . ESOPHAGOGASTRODUODENOSCOPY N/A 03/03/2016   Procedure: ESOPHAGOGASTRODUODENOSCOPY (EGD);  Surgeon: Malissa HippoNajeeb U Rehman, MD;  Location: [facility];  Service: Endoscopy;  Laterality: N/A;  . FRACTURE SURGERY      OB History    Gravida Para Term Preterm AB Living   0             SAB TAB Ectopic Multiple Live Births                   Home Medications    Prior to Admission medications   Medication Sig Start Date End Date Taking? Authorizing Provider  calcium carbonate (OS-CAL) 600 MG TABS Take 600 mg by mouth 2 (two) times daily with a meal.   Yes  [provider]  cefUROXime (CEFTIN) 500 MG tablet Take 1 tablet (500 mg total) by mouth 2 (two) times daily with a meal. Patient not taking: Reported on 06/18/2016 03/04/16   Catarina Hartshornat, David, MD  escitalopram (LEXAPRO) 10 MG tablet Take 10 mg by mouth daily.    [provider]  gabapentin (NEURONTIN) 300 MG capsule Take 300 mg by mouth 3 (three) times daily.    [provider]  glipiZIDE-metformin (METAGLIP) 5-500 MG per tablet Take 1 tablet by mouth 2 (two) times daily before a meal.    [provider]  LORazepam (ATIVAN) 0.5 MG tablet Take 0.5 mg by mouth every 4 (four) hours as needed for anxiety.    [provider]  mirabegron ER (MYRBETRIQ) 25 MG TB24 tablet Take 25 mg by mouth daily.    [provider]  simvastatin (ZOCOR) 40 MG tablet Take 40 mg by mouth every evening.    [provider]    Family History No family history on file.  Social History Social History  Substance Use Topics  . Smoking status: Current Some Day Smoker    Packs/day: 0.00    Types: Cigarettes  . Smokeless tobacco: Never Used     Comment: 2 cigarettes daily   . Alcohol use No     Allergies  Penicillins   Review of Systems Review of Systems  Constitutional: Positive for fatigue. Negative for appetite change.  Respiratory: Positive for shortness of breath.   Cardiovascular: Negative for chest pain.  Gastrointestinal: Positive for abdominal pain. Negative for anal bleeding.  Musculoskeletal: Negative for back pain.  Skin: Negative for color change.  Neurological: Positive for weakness and light-headedness.  Hematological: Does not bruise/bleed easily.  Psychiatric/Behavioral: Negative for confusion.     Physical Exam Updated Vital Signs BP (!) 119/54 (BP Location: Right Arm)   Pulse 95   Temp 98.2 F (36.8 C) (Oral)   Ht 5\' 4"  (1.626 m)   Wt 80.3 kg (177 lb)   SpO2 98%   BMI 30.38 kg/m   Physical Exam  Constitutional: She  appears well-developed.  HENT:  Head: Normocephalic.  Eyes: Pupils are equal, round, and reactive to light.  Neck: Neck supple.  Cardiovascular: Normal rate.   Pulmonary/Chest: Effort normal.  Abdominal: There is tenderness.  Mild right-sided tenderness without mass rebound or guarding. Mild distention.  Musculoskeletal: She exhibits no edema.  Neurological: She is alert.  Skin: Capillary refill takes less than 2 seconds. There is pallor.     ED Treatments / Results  Labs (all labs ordered are listed, but only abnormal results are displayed) Labs Reviewed  COMPREHENSIVE METABOLIC PANEL - Abnormal; Notable for the following:       Result Value   BUN 24 (*)    Creatinine, Ser 1.42 (*)    Calcium 8.6 (*)    Albumin 3.2 (*)    ALT 10 (*)    Total Bilirubin 0.2 (*)    GFR calc non Af Amer 36 (*)    GFR calc Af Amer 41 (*)    All other components within normal limits  CBC WITH DIFFERENTIAL/PLATELET - Abnormal; Notable for the following:    RBC 2.73 (*)    Hemoglobin 6.9 (*)    HCT 22.6 (*)    MCH 25.3 (*)    RDW 16.4 (*)    All other components within normal limits  TYPE AND SCREEN    EKG  EKG Interpretation None       Radiology No results found.  Procedures Procedures (including critical care time)  Medications Ordered in ED Medications - No data to display   Initial Impression / Assessment and Plan / ED Course  I have reviewed the triage vital signs and the nursing notes.  Pertinent labs & imaging results that were available during my care of the patient were reviewed by me and considered in my medical decision making (see chart for details).     Patient with anemia. Hemoglobin of 6.9. Symptomatic with shortness of breath and fatigue. Refuses rectal exam. Does have some dull right-sided abdominal pain. Will admit to internal medicine.  Patient's had anemia in the past. Had endoscopy by Dr. Karilyn Cota. Do not think she has had colonoscopy. Final Clinical  Impressions(s) / ED Diagnoses   Final diagnoses:  Anemia, unspecified type    New Prescriptions New Prescriptions   No medications on file     Benjiman Core, MD 06/18/16 1347    Benjiman Core, MD 06/18/16 1350

## 2016-06-18 NOTE — ED Triage Notes (Addendum)
Pt [facility] today and told pt to come to ED for hemoglobin level of 6. Pt had check up 2 days ago and had labwork performed at that time. Pt reports dizziness and feeling warm. Denies bloody stool. Denies pain.

## 2016-06-19 DIAGNOSIS — D500 Iron deficiency anemia secondary to blood loss (chronic): Secondary | ICD-10-CM

## 2016-06-19 DIAGNOSIS — K922 Gastrointestinal hemorrhage, unspecified: Secondary | ICD-10-CM

## 2016-06-19 LAB — CBC
HEMATOCRIT: 32.8 % — AB (ref 36.0–46.0)
HEMOGLOBIN: 10.1 g/dL — AB (ref 12.0–15.0)
MCH: 25.6 pg — ABNORMAL LOW (ref 26.0–34.0)
MCHC: 30.8 g/dL (ref 30.0–36.0)
MCV: 83 fL (ref 78.0–100.0)
Platelets: 323 10*3/uL (ref 150–400)
RBC: 3.95 MIL/uL (ref 3.87–5.11)
RDW: 15.7 % — ABNORMAL HIGH (ref 11.5–15.5)
WBC: 7.7 10*3/uL (ref 4.0–10.5)

## 2016-06-19 LAB — GLUCOSE, CAPILLARY
GLUCOSE-CAPILLARY: 135 mg/dL — AB (ref 65–99)
GLUCOSE-CAPILLARY: 163 mg/dL — AB (ref 65–99)
Glucose-Capillary: 139 mg/dL — ABNORMAL HIGH (ref 65–99)
Glucose-Capillary: 148 mg/dL — ABNORMAL HIGH (ref 65–99)

## 2016-06-19 LAB — BASIC METABOLIC PANEL
ANION GAP: 10 (ref 5–15)
BUN: 21 mg/dL — ABNORMAL HIGH (ref 6–20)
CALCIUM: 9 mg/dL (ref 8.9–10.3)
CO2: 28 mmol/L (ref 22–32)
Chloride: 100 mmol/L — ABNORMAL LOW (ref 101–111)
Creatinine, Ser: 1.18 mg/dL — ABNORMAL HIGH (ref 0.44–1.00)
GFR, EST AFRICAN AMERICAN: 52 mL/min — AB (ref >60)
GFR, EST NON AFRICAN AMERICAN: 45 mL/min — AB (ref >60)
GLUCOSE: 130 mg/dL — AB (ref 65–99)
POTASSIUM: 4.1 mmol/L (ref 3.5–5.1)
Sodium: 138 mmol/L (ref 135–145)

## 2016-06-19 LAB — HEMOGLOBIN AND HEMATOCRIT, BLOOD
HCT: 30 % — ABNORMAL LOW (ref 36.0–46.0)
HEMOGLOBIN: 9.6 g/dL — AB (ref 12.0–15.0)

## 2016-06-19 MED ORDER — BISACODYL 5 MG PO TBEC
10.0000 mg | DELAYED_RELEASE_TABLET | Freq: Once | ORAL | Status: AC
  Administered 2016-06-19: 10 mg via ORAL
  Filled 2016-06-19: qty 2

## 2016-06-19 MED ORDER — PEG 3350-KCL-NA BICARB-NACL 420 G PO SOLR
4000.0000 mL | Freq: Once | ORAL | Status: AC
  Administered 2016-06-19: 4000 mL via ORAL
  Filled 2016-06-19: qty 4000

## 2016-06-19 MED ORDER — SODIUM CHLORIDE 0.9 % IV SOLN
INTRAVENOUS | Status: DC
  Administered 2016-06-19 – 2016-06-20 (×2): via INTRAVENOUS

## 2016-06-19 MED ORDER — SODIUM CHLORIDE 0.9 % IV SOLN: INTRAVENOUS | Status: DC

## 2016-06-19 NOTE — Consult Note (Signed)
Reason for Consult Referring Physician:  [name] is an 76 y.o. female.  HPI: Admitted thru the ED yesterday.  She was seen at [facility] and noted to have hemoglobin of 6. She was referred to the ED. She says she has had some rectal bleeding . She states she has had some black stools in the past couple of weeks.  Last saw blood last week. She has had dizziness. She refused rectal exam in the ED. She has received 2 units of PRBCs. Admitted in February with anemia/melena and underwent an EGD which revealed: Impression:               - Normal esophagus.                           - Z-line irregular, 34 cm from the incisors.                           - Mild changes of reflux esophagitis at GEJ.                           - 7 cm hiatal hernia.                           - Scar in the gastric antrum (lesser curvature).                           - Erosive gastropathy. Biopsied.                           - Normal duodenal bulb and second portion of the                            duodenum. She states she has not been taking any BC powders.    This SmartLink has not been configured with any valid records.    Past Medical History:  Diagnosis Date  . Diabetes mellitus without complication ([place])   . Hypercholesterolemia   . Hypertension     Past Surgical History:  Procedure Laterality Date  . ABDOMINAL HYSTERECTOMY    . ESOPHAGOGASTRODUODENOSCOPY N/A 03/03/2016   Procedure: ESOPHAGOGASTRODUODENOSCOPY (EGD);  Surgeon: Rogene Houston, MD;  Location: [facility];  Service: Endoscopy;  Laterality: N/A;  . FRACTURE SURGERY      History reviewed. No pertinent family history.  Social History:  reports that she has been smoking Cigarettes.  She has been smoking about 0.00 packs per day. She has never used smokeless tobacco. She reports that she does not drink alcohol or use drugs.  Allergies:  Allergies  Allergen Reactions  . Penicillins Rash    Has patient had a PCN reaction  causing immediate rash, facial/tongue/throat swelling, SOB or lightheadedness with hypotension: No Has patient had a PCN reaction causing severe rash involving mucus membranes or skin necrosis: No Has patient had a PCN reaction that required hospitalization No Has patient had a PCN reaction occurring within the last 10 years: No If all of the above answers are "NO", then may proceed with Cephalosporin use.      Medications: I have reviewed the patient's current medications.  Results for orders placed or performed during the hospital encounter of 06/18/16 (from the  past 48 hour(s))  Comprehensive metabolic panel     Status: Abnormal   Collection Time: 06/18/16 12:40 PM  Result Value Ref Range   Sodium 140 135 - 145 mmol/L   Potassium 4.7 3.5 - 5.1 mmol/L   Chloride 106 101 - 111 mmol/L   CO2 25 22 - 32 mmol/L   Glucose, Bld 74 65 - 99 mg/dL   BUN 24 (H) 6 - 20 mg/dL   Creatinine, Ser 1.42 (H) 0.44 - 1.00 mg/dL   Calcium 8.6 (L) 8.9 - 10.3 mg/dL   Total Protein 7.1 6.5 - 8.1 g/dL   Albumin 3.2 (L) 3.5 - 5.0 g/dL   AST 18 15 - 41 U/L   ALT 10 (L) 14 - 54 U/L   Alkaline Phosphatase 62 38 - 126 U/L   Total Bilirubin 0.2 (L) 0.3 - 1.2 mg/dL   GFR calc non Af Amer 36 (L) >60 mL/min   GFR calc Af Amer 41 (L) >60 mL/min    Comment: (NOTE) The eGFR has been calculated using the CKD EPI equation. This calculation has not been validated in all clinical situations. eGFR's persistently <60 mL/min signify possible Chronic Kidney Disease.    Anion gap 9 5 - 15  CBC with Differential     Status: Abnormal   Collection Time: 06/18/16 12:40 PM  Result Value Ref Range   WBC 7.3 4.0 - 10.5 K/uL   RBC 2.73 (L) 3.87 - 5.11 MIL/uL   Hemoglobin 6.9 (LL) 12.0 - 15.0 g/dL    Comment: REPEATED TO VERIFY CRITICAL RESULT CALLED TO, READ BACK BY AND VERIFIED WITH: HYATT,C RN AT 1300 BY HFLYNT 06/18/16    HCT 22.6 (L) 36.0 - 46.0 %   MCV 82.8 78.0 - 100.0 fL   MCH 25.3 (L) 26.0 - 34.0 pg   MCHC 30.5  30.0 - 36.0 g/dL   RDW 16.4 (H) 11.5 - 15.5 %   Platelets 268 150 - 400 K/uL   Neutrophils Relative % 61 %   Neutro Abs 4.5 1.7 - 7.7 K/uL   Lymphocytes Relative 27 %   Lymphs Abs 2.0 0.7 - 4.0 K/uL   Monocytes Relative 7 %   Monocytes Absolute 0.5 0.1 - 1.0 K/uL   Eosinophils Relative 4 %   Eosinophils Absolute 0.3 0.0 - 0.7 K/uL   Basophils Relative 0 %   Basophils Absolute 0.0 0.0 - 0.1 K/uL  Type and screen [facility]     Status: None   Collection Time: 06/18/16 12:44 PM  Result Value Ref Range   ABO/RH(D) A POS    Antibody Screen NEG    Sample Expiration 06/21/2016    Unit Number [id number]    Blood Component Type RED CELLS,LR    Unit division 00    Status of Unit ISSUED,FINAL    Transfusion Status OK TO TRANSFUSE    Crossmatch Result Compatible    Unit Number [id number]    Blood Component Type RED CELLS,LR    Unit division 00    Status of Unit ISSUED,FINAL    Transfusion Status OK TO TRANSFUSE    Crossmatch Result Compatible   Prepare RBC     Status: None   Collection Time: 06/18/16 12:44 PM  Result Value Ref Range   Order Confirmation ORDER PROCESSED BY BLOOD BANK   Glucose, capillary     Status: None   Collection Time: 06/18/16  4:48 PM  Result Value Ref Range   Glucose-Capillary 82 65 - 99 mg/dL  Comment 1 Notify RN    Comment 2 Document in Chart   Glucose, capillary     Status: Abnormal   Collection Time: 06/18/16 10:36 PM  Result Value Ref Range   Glucose-Capillary 101 (H) 65 - 99 mg/dL   Comment 1 Notify RN    Comment 2 Document in Chart   Basic metabolic panel     Status: Abnormal   Collection Time: 06/19/16  5:54 AM  Result Value Ref Range   Sodium 138 135 - 145 mmol/L   Potassium 4.1 3.5 - 5.1 mmol/L   Chloride 100 (L) 101 - 111 mmol/L   CO2 28 22 - 32 mmol/L   Glucose, Bld 130 (H) 65 - 99 mg/dL   BUN 21 (H) 6 - 20 mg/dL   Creatinine, Ser 1.18 (H) 0.44 - 1.00 mg/dL   Calcium 9.0 8.9 - 10.3 mg/dL   GFR calc non Af Amer 45 (L) >60  mL/min   GFR calc Af Amer 52 (L) >60 mL/min    Comment: (NOTE) The eGFR has been calculated using the CKD EPI equation. This calculation has not been validated in all clinical situations. eGFR's persistently <60 mL/min signify possible Chronic Kidney Disease.    Anion gap 10 5 - 15  CBC     Status: Abnormal   Collection Time: 06/19/16  5:54 AM  Result Value Ref Range   WBC 7.7 4.0 - 10.5 K/uL   RBC 3.95 3.87 - 5.11 MIL/uL   Hemoglobin 10.1 (L) 12.0 - 15.0 g/dL    Comment: DELTA CHECK NOTED   HCT 32.8 (L) 36.0 - 46.0 %   MCV 83.0 78.0 - 100.0 fL   MCH 25.6 (L) 26.0 - 34.0 pg   MCHC 30.8 30.0 - 36.0 g/dL   RDW 15.7 (H) 11.5 - 15.5 %   Platelets 323 150 - 400 K/uL  Glucose, capillary     Status: Abnormal   Collection Time: 06/19/16  7:30 AM  Result Value Ref Range   Glucose-Capillary 135 (H) 65 - 99 mg/dL  Glucose, capillary     Status: Abnormal   Collection Time: 06/19/16 11:13 AM  Result Value Ref Range   Glucose-Capillary 163 (H) 65 - 99 mg/dL    No results found.  ROS Blood pressure (!) 122/53, pulse 80, temperature 98.5 F (36.9 C), temperature source Oral, resp. rate 18, height 5' 4"  (1.626 m), weight 177 lb (80.3 kg), SpO2 97 %. Physical Exam Alert and oriented. Skin warm and dry. Oral mucosa is moist.   . Sclera anicteric, conjunctivae is pink. Thyroid not enlarged. No cervical lymphadenopathy. Lungs clear. Heart regular rate and rhythm.  Abdomen is soft. Bowel sounds are positive. No hepatomegaly. No abdominal masses felt. No tenderness.  No edema to lower extremities.   Assessment/Plan: Anemia. Has received 2 units of PRBCs. ? GI bleed. Patient states she has had BRRB and melena. Dr. Laural Golden is aware. Last EGD in February.   Bill Yohn W  06/19/2016, 11:52 AM

## 2016-06-19 NOTE — Care Management Note (Signed)
Case Management Note  Patient Details  Name: Cassell SmilesBrenda S Macleod MRN: [id number] Date of Birth: [birth year]  Subjective/Objective:                  Pt admitted from home, lives alone and is ind with ADL's. She has AID 5 days a week, 4 hrs a day. She uses walker with ambulation. She has used [facility] in the past but does not feel she needs them at this time.   Action/Plan: Pt plans to return home with self care. CM will cont to follow.   Expected Discharge Date:      06/19/2016            Expected Discharge Plan:  Home/Self Care  In-House Referral:  NA  Discharge planning Services  CM Consult  Post Acute Care Choice:  NA Choice offered to:  NA  Status of Service:  In process, will continue to follow  Malcolm MetroChildress, Braylea Brancato Demske, RN 06/19/2016, 2:37 PM
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
## 2016-06-19 NOTE — Progress Notes (Signed)
Patient Demographics:    [name] Archambeault, is a 76 y.o. female, DOB - [birth year], ZOX:[id number]  Admit date - 06/18/2016   Admitting Physician Athira Janowicz Mariea ClontsEmokpae, MD  Outpatient Primary MD for the patient is Pearson GrippeKim, James, MD  LOS - 1   Chief Complaint  Patient presents with  . Abnormal Lab        Subjective:    [name] Delk today has no fevers, no emesis,  No chest pain,  No further BMs, no abdominal pain, no dizziness   Assessment  & Plan :    Principal Problem:   GI (gastrointestinal bleed) Active Problems:   HTN (hypertension)   Diabetes (HCC)   Anemia   Anemia due to acute blood loss   CKD (chronic kidney disease), stage III   1)Symptomatic Anemia- suspect acute blood loss anemia, hemoglobin is up to 10.1 from 6.9 after transfusion of 2 units of packed cells on 06/18/2016.   Patient had EGD on 03/03/2016 with findings of erosive gastritis and GERD type symptoms, patient declined colonoscopy at that time. GI consult pending for possible colonoscopy this time around, patient is agreeable to undergo colonoscopy this time around . Follow H&H and transfuse as clinically indicated  2)DM- Allow some permissive Hyperglycemia rather than risk life-threatening hypoglycemia in a patient with unreliable oral intake due to acute GI bleed. Hold glipizide/metformin. Use Novolog/Humalog Sliding scale insulin with Accu-Cheks/Fingersticks as ordered  3)CKD stage III- renal function appears to be at baseline at this time, avoid nephrotoxic agents  Code Status : Full   Disposition Plan  : home  Consults  :  Gi   DVT Prophylaxis  :   SCDs   Lab Results  Component Value Date   PLT 323 06/19/2016    Inpatient Medications  Scheduled Meds: . pantoprazole (PROTONIX) IV  40 mg Intravenous BID  . senna  1 tablet Oral BID  . sodium chloride flush  3 mL Intravenous Q12H   Continuous Infusions: . sodium  chloride    . sodium chloride 50 mL/hr at 06/19/16 0920   PRN Meds:.sodium chloride, acetaminophen **OR** acetaminophen, albuterol, insulin aspart, ondansetron **OR** ondansetron (ZOFRAN) IV, polyethylene glycol, sodium chloride flush, traZODone    Anti-infectives    None        Objective:   Vitals:   06/18/16 2106 06/18/16 2121 06/19/16 0000 06/19/16 0543  BP: (!) 161/66 (!) 136/59 (!) 127/51 (!) 122/53  Pulse: 69 66 71 80  Resp: 16 17 17 18   Temp: 98.4 F (36.9 C) 98.6 F (37 C) 98.5 F (36.9 C) 98.5 F (36.9 C)  TempSrc: Oral Oral Oral Oral  SpO2: 100% 98% 96% 97%  Weight:      Height:        Wt Readings from Last 3 Encounters:  06/18/16 80.3 kg (177 lb)  02/29/16 80.6 kg (177 lb 12.8 oz)  09/27/13 82.1 kg (181 lb)     Intake/Output Summary (Last 24 hours) at 06/19/16 1404 Last data filed at 06/19/16 0900  Gross per 24 hour  Intake             1145 ml  Output                0 ml  Net  1145 ml     Physical Exam  Gen:- Awake Alert,  In no apparent distress  HEENT:- [place].AT, No sclera icterus Neck-Supple Neck,No JVD,.  Lungs-  CTAB  CV- S1, S2 normal Abd-  +ve B.Sounds, Abd Soft, No tenderness,    Extremity/Skin:- Warm and dry     Data Review:   Micro Results No results found for this or any previous visit (from the past 240 hour(s)).  Radiology Reports No results found.   CBC  Recent Labs Lab 06/18/16 1240 06/19/16 0554  WBC 7.3 7.7  HGB 6.9* 10.1*  HCT 22.6* 32.8*  PLT 268 323  MCV 82.8 83.0  MCH 25.3* 25.6*  MCHC 30.5 30.8  RDW 16.4* 15.7*  LYMPHSABS 2.0  --   MONOABS 0.5  --   EOSABS 0.3  --   BASOSABS 0.0  --     Chemistries   Recent Labs Lab 06/18/16 1240 06/19/16 0554  NA 140 138  K 4.7 4.1  CL 106 100*  CO2 25 28  GLUCOSE 74 130*  BUN 24* 21*  CREATININE 1.42* 1.18*  CALCIUM 8.6* 9.0  AST 18  --   ALT 10*  --   ALKPHOS 62  --   BILITOT 0.2*  --     ------------------------------------------------------------------------------------------------------------------ No results for input(s): CHOL, HDL, LDLCALC, TRIG, CHOLHDL, LDLDIRECT in the last 72 hours.  Lab Results  Component Value Date   HGBA1C 7.1 (H) 02/29/2016   ------------------------------------------------------------------------------------------------------------------ No results for input(s): TSH, T4TOTAL, T3FREE, THYROIDAB in the last 72 hours.  Invalid input(s): FREET3 ------------------------------------------------------------------------------------------------------------------ No results for input(s): VITAMINB12, FOLATE, FERRITIN, TIBC, IRON, RETICCTPCT in the last 72 hours.  Coagulation profile No results for input(s): INR, PROTIME in the last 168 hours.  No results for input(s): DDIMER in the last 72 hours.  Cardiac Enzymes No results for input(s): CKMB, TROPONINI, MYOGLOBIN in the last 168 hours.  Invalid input(s): CK ------------------------------------------------------------------------------------------------------------------ No results found for: BNP   Yusef Lamp M.D on 06/19/2016 at 2:04 PM  Between 7am to 7pm - Pager - [phone]  After 7pm go to [email or web address] - password [email or web address]  [facility] -  Office  [phone]   Voice Recognition Reubin Milan dictation system was used to create this note, attempts have been made to correct errors. Please contact the author with questions and/or clarifications.

## 2016-06-20 ENCOUNTER — Inpatient Hospital Stay (HOSPITAL_COMMUNITY): Payer: Medicare Other | Admitting: Anesthesiology

## 2016-06-20 ENCOUNTER — Encounter (HOSPITAL_COMMUNITY): Payer: Self-pay | Admitting: *Deleted

## 2016-06-20 ENCOUNTER — Encounter (HOSPITAL_COMMUNITY)
Admission: EM | Disposition: A | Discharge status: Home / Self Care | Payer: Self-pay | Source: Home / Self Care | Attending: Family Medicine

## 2016-06-20 ENCOUNTER — Inpatient Hospital Stay (HOSPITAL_COMMUNITY): Payer: Medicare Other

## 2016-06-20 DIAGNOSIS — K449 Diaphragmatic hernia without obstruction or gangrene: Secondary | ICD-10-CM

## 2016-06-20 DIAGNOSIS — K922 Gastrointestinal hemorrhage, unspecified: Secondary | ICD-10-CM

## 2016-06-20 DIAGNOSIS — K259 Gastric ulcer, unspecified as acute or chronic, without hemorrhage or perforation: Secondary | ICD-10-CM

## 2016-06-20 DIAGNOSIS — K644 Residual hemorrhoidal skin tags: Secondary | ICD-10-CM

## 2016-06-20 DIAGNOSIS — D500 Iron deficiency anemia secondary to blood loss (chronic): Secondary | ICD-10-CM

## 2016-06-20 DIAGNOSIS — K5730 Diverticulosis of large intestine without perforation or abscess without bleeding: Secondary | ICD-10-CM

## 2016-06-20 HISTORY — PX: COLONOSCOPY WITH PROPOFOL: SHX5780

## 2016-06-20 HISTORY — PX: ESOPHAGOGASTRODUODENOSCOPY (EGD) WITH PROPOFOL: SHX5813

## 2016-06-20 LAB — CBC WITH DIFFERENTIAL/PLATELET
Basophils Absolute: 0 10*3/uL (ref 0.0–0.1)
Basophils Relative: 0 %
EOS PCT: 1 %
Eosinophils Absolute: 0.1 10*3/uL (ref 0.0–0.7)
HCT: 30.7 % — ABNORMAL LOW (ref 36.0–46.0)
Hemoglobin: 9.7 g/dL — ABNORMAL LOW (ref 12.0–15.0)
LYMPHS ABS: 0.7 10*3/uL (ref 0.7–4.0)
Lymphocytes Relative: 9 %
MCH: 26.2 pg (ref 26.0–34.0)
MCHC: 31.6 g/dL (ref 30.0–36.0)
MCV: 83 fL (ref 78.0–100.0)
MONOS PCT: 4 %
Monocytes Absolute: 0.3 10*3/uL (ref 0.1–1.0)
Neutro Abs: 6.8 10*3/uL (ref 1.7–7.7)
Neutrophils Relative %: 86 %
PLATELETS: 300 10*3/uL (ref 150–400)
RBC: 3.7 MIL/uL — ABNORMAL LOW (ref 3.87–5.11)
RDW: 16 % — AB (ref 11.5–15.5)
WBC: 7.8 10*3/uL (ref 4.0–10.5)

## 2016-06-20 LAB — CBC
HCT: 31 % — ABNORMAL LOW (ref 36.0–46.0)
Hemoglobin: 9.8 g/dL — ABNORMAL LOW (ref 12.0–15.0)
MCH: 26 pg (ref 26.0–34.0)
MCHC: 31.6 g/dL (ref 30.0–36.0)
MCV: 82.2 fL (ref 78.0–100.0)
PLATELETS: 322 10*3/uL (ref 150–400)
RBC: 3.77 MIL/uL — AB (ref 3.87–5.11)
RDW: 15.9 % — AB (ref 11.5–15.5)
WBC: 8.2 10*3/uL (ref 4.0–10.5)

## 2016-06-20 LAB — BASIC METABOLIC PANEL
ANION GAP: 9 (ref 5–15)
BUN: 12 mg/dL (ref 6–20)
CALCIUM: 9 mg/dL (ref 8.9–10.3)
CO2: 27 mmol/L (ref 22–32)
Chloride: 102 mmol/L (ref 101–111)
Creatinine, Ser: 1.01 mg/dL — ABNORMAL HIGH (ref 0.44–1.00)
GFR calc Af Amer: >60 mL/min (ref >60)
GFR calc non Af Amer: 54 mL/min — ABNORMAL LOW (ref >60)
GLUCOSE: 160 mg/dL — AB (ref 65–99)
Potassium: 3.7 mmol/L (ref 3.5–5.1)
SODIUM: 138 mmol/L (ref 135–145)

## 2016-06-20 LAB — GLUCOSE, CAPILLARY
GLUCOSE-CAPILLARY: 169 mg/dL — AB (ref 65–99)
GLUCOSE-CAPILLARY: 272 mg/dL — AB (ref 65–99)
Glucose-Capillary: 142 mg/dL — ABNORMAL HIGH (ref 65–99)
Glucose-Capillary: 149 mg/dL — ABNORMAL HIGH (ref 65–99)
Glucose-Capillary: 152 mg/dL — ABNORMAL HIGH (ref 65–99)

## 2016-06-20 SURGERY — COLONOSCOPY WITH PROPOFOL
Anesthesia: Monitor Anesthesia Care

## 2016-06-20 MED ORDER — NYSTATIN-TRIAMCINOLONE 100000-0.1 UNIT/GM-% EX OINT
TOPICAL_OINTMENT | Freq: Two times a day (BID) | CUTANEOUS | Status: DC
  Filled 2016-06-20: qty 15

## 2016-06-20 MED ORDER — PIPERACILLIN-TAZOBACTAM 3.375 G IVPB
3.3750 g | Freq: Three times a day (TID) | INTRAVENOUS | Status: DC
  Administered 2016-06-20 – 2016-06-23 (×8): 3.375 g via INTRAVENOUS
  Filled 2016-06-20 (×8): qty 50

## 2016-06-20 MED ORDER — LIDOCAINE VISCOUS 2 % MT SOLN: 15.0000 mL | Freq: Once | OROMUCOSAL | Status: DC

## 2016-06-20 MED ORDER — PROPOFOL 10 MG/ML IV BOLUS
INTRAVENOUS | Status: AC
  Filled 2016-06-20: qty 20

## 2016-06-20 MED ORDER — NYSTATIN-TRIAMCINOLONE 100000-0.1 UNIT/GM-% EX CREA
TOPICAL_CREAM | Freq: Two times a day (BID) | CUTANEOUS | Status: DC
  Administered 2016-06-20 – 2016-06-22 (×5): via TOPICAL
  Administered 2016-06-23: 1 via TOPICAL
  Filled 2016-06-20: qty 15

## 2016-06-20 MED ORDER — PROPOFOL 500 MG/50ML IV EMUL
INTRAVENOUS | Status: DC | PRN
  Administered 2016-06-20: 15:00:00 via INTRAVENOUS
  Administered 2016-06-20: 125 ug/kg/min via INTRAVENOUS

## 2016-06-20 MED ORDER — LACTATED RINGERS IV SOLN
INTRAVENOUS | Status: DC
  Administered 2016-06-20: 14:00:00 via INTRAVENOUS

## 2016-06-20 MED ORDER — MAGNESIUM CITRATE PO SOLN
1.0000 | ORAL | Status: AC
  Administered 2016-06-20: 1 via ORAL
  Filled 2016-06-20: qty 296

## 2016-06-20 MED ORDER — LIDOCAINE VISCOUS 2 % MT SOLN
OROMUCOSAL | Status: AC
  Filled 2016-06-20: qty 15

## 2016-06-20 MED ORDER — MIDAZOLAM HCL 2 MG/2ML IJ SOLN
INTRAMUSCULAR | Status: AC
  Filled 2016-06-20: qty 2

## 2016-06-20 MED ORDER — FERROUS SULFATE 325 (65 FE) MG PO TABS
325.0000 mg | ORAL_TABLET | Freq: Every day | ORAL | Status: DC
  Administered 2016-06-21 – 2016-06-23 (×3): 325 mg via ORAL
  Filled 2016-06-20 (×3): qty 1

## 2016-06-20 MED ORDER — PANTOPRAZOLE SODIUM 40 MG PO TBEC
40.0000 mg | DELAYED_RELEASE_TABLET | Freq: Two times a day (BID) | ORAL | Status: DC
  Administered 2016-06-20 – 2016-06-23 (×6): 40 mg via ORAL
  Filled 2016-06-20 (×6): qty 1

## 2016-06-20 MED ORDER — LIDOCAINE VISCOUS 2 % MT SOLN
15.0000 mL | Freq: Once | OROMUCOSAL | Status: AC
  Administered 2016-06-20: 15 mL via OROMUCOSAL

## 2016-06-20 MED ORDER — FENTANYL CITRATE (PF) 100 MCG/2ML IJ SOLN
25.0000 ug | Freq: Once | INTRAMUSCULAR | Status: AC
  Administered 2016-06-20: 25 ug via INTRAVENOUS

## 2016-06-20 MED ORDER — MIDAZOLAM HCL 2 MG/2ML IJ SOLN
1.0000 mg | INTRAMUSCULAR | Status: AC
  Administered 2016-06-20: 2 mg via INTRAVENOUS

## 2016-06-20 MED ORDER — FENTANYL CITRATE (PF) 100 MCG/2ML IJ SOLN
INTRAMUSCULAR | Status: AC
  Filled 2016-06-20: qty 2

## 2016-06-20 NOTE — Progress Notes (Signed)
Upon assessment noticed IV was infiltrated. Adv patient we needed to start new IV. She was hesitant but let me. When I was trying she pulled her hand back. I tried again and she pulled it back again. She yelled at me and told me I was hurting her. I adv I would need another nurse to try but she would have to stay still longer enough to secure it. She yelled and cursed! She said no one was going to try or she would slap their face. Explained that we need a working IV for her procedure in the am and she said she did not care about it and no one was going to touch her. I notified MD. He adv to try again in am.

## 2016-06-20 NOTE — Progress Notes (Signed)
Brief procedure report.  Colonoscopy:  Examination performed to cecum. Redundant colon with moderate number of diverticula at sigmoid colon. Large external hemorrhoids.  EGD:  Normal esophagus. Large sliding hiatal hernia. 12 mm ulcer at the level of diaphragmatic hiatus along the lesser curvature without stigmata of bleed Cameron lesion) Normal bulbar and post bulbar mucosa.

## 2016-06-20 NOTE — Op Note (Signed)
[facility] Patient Name: Nicole FeinsteinBrenda [name] Procedure Date: 06/20/2016 11:20 AM MRN: [id number] Date of Birth: 1940/12/06 Attending MD: Lionel DecemberNajeeb Loel Betancur , MD CSN: [id number] Age: [age] Admit Type: Outpatient Procedure:                Colonoscopy Indications:              Gastrointestinal bleeding, Iron deficiency anemia                            secondary to chronic blood loss Providers:                Lionel DecemberNajeeb Cataleya Cristina, MD, Loma MessingLurae B. Patsy LagerAlbert RN, RN, Dyann Ruddleonya                            Wilson Referring MD:             Shon Haleourage Emokpae, MD Medicines:                Propofol per Anesthesia Complications:            No immediate complications. Estimated Blood Loss:     Estimated blood loss: none. Procedure:                Pre-Anesthesia Assessment:                           - Prior to the procedure, a History and Physical                            was performed, and patient medications and                            allergies were reviewed. The patient's tolerance of                            previous anesthesia was also reviewed. The risks                            and benefits of the procedure and the sedation                            options and risks were discussed with the patient.                            All questions were answered, and informed consent                            was obtained. Prior Anticoagulants: The patient has                            taken no previous anticoagulant or antiplatelet                            agents. ASA Grade Assessment: III - A patient with  severe systemic disease. After reviewing the risks                            and benefits, the patient was deemed in                            satisfactory condition to undergo the procedure.                           After obtaining informed consent, the colonoscope                            was passed under direct vision. Throughout the                            procedure,  the patient's blood pressure, pulse, and                            oxygen saturations were monitored continuously. The                            [id number]-[id number] ([id number]) scope was introduced through                            the anus and advanced to the the cecum, identified                            by appendiceal orifice and ileocecal valve. The                            colonoscopy was somewhat difficult due to a                            tortuous colon. Successful completion of the                            procedure was aided by using manual pressure and                            withdrawing and reinserting the scope. The patient                            tolerated the procedure well. The quality of the                            bowel preparation was good. The ileocecal valve,                            appendiceal orifice, and rectum were photographed. Scope In: 3:14:27 PM Scope Out: 3:35:19 PM Scope Withdrawal Time: 0 hours 5 minutes 55 seconds  Total Procedure Duration: 0 hours 20 minutes 52 seconds  Findings:      The perianal and digital rectal examinations were normal.      Multiple small and large-mouthed  diverticula were found in the sigmoid       colon.      The exam was otherwise normal throughout the examined colon.      External hemorrhoids were found during retroflexion. The hemorrhoids       were large. Impression:               - Diverticulosis in the sigmoid colon.                           - External hemorrhoids.                           - No specimens collected. Moderate Sedation:      Per Anesthesia Care Recommendation:           - Proceed with EGD.                           - See the other procedure note for documentation of                            additional recommendations.                           - Resume previous diet.                           - Continue present medications.                           - No repeat colonoscopy due to age and the  absence                            of advanced adenomas.                           - Patient has a contact number available for                            emergencies. The signs and symptoms of potential                            delayed complications were discussed with the                            patient. Return to normal activities tomorrow.                            Written discharge instructions were provided to the                            patient. Procedure Code(s):        --- Professional ---                           [phone], Colonoscopy, flexible; diagnostic, including  collection of specimen(s) by brushing or washing,                            when performed (separate procedure) Diagnosis Code(s):        --- Professional ---                           K64.4, Residual hemorrhoidal skin tags                           K92.2, Gastrointestinal hemorrhage, unspecified                           D50.0, Iron deficiency anemia secondary to blood                            loss (chronic)                           K57.30, Diverticulosis of large intestine without                            perforation or abscess without bleeding CPT copyright 2016 American Medical Association. All rights reserved. The codes documented in this report are preliminary and upon coder review may  be revised to meet current compliance requirements. Lionel December, MD Lionel December, MD 06/20/2016 4:26:16 PM This report has been signed electronically. Number of Addenda: 0

## 2016-06-20 NOTE — Progress Notes (Signed)
Pharmacy Antibiotic Note  [name] is a 76 y.o. female admitted on 06/18/2016 with anemia.  Pharmacy has been consulted for zosyn dosing.  Plan: Zosyn 3.375g IV q8h (4 hour infusion).  Height: 5\' 4"  (162.6 cm) Weight: 177 lb (80.3 kg) IBW/kg (Calculated) : 54.7  Temp (24hrs), Avg:98.8 F (37.1 C), Min:97.5 F (36.4 C), Max:103.2 F (39.6 C)   Recent Labs Lab 06/18/16 1240 06/19/16 0554 06/20/16 0619  WBC 7.3 7.7 8.2  CREATININE 1.42* 1.18* 1.01*    Estimated Creatinine Clearance: 50.8 mL/min (A) (by C-G formula based on SCr of 1.01 mg/dL (H)).    Allergies  Allergen Reactions  . Penicillins Rash    Has patient had a PCN reaction causing immediate rash, facial/tongue/throat swelling, SOB or lightheadedness with hypotension: No Has patient had a PCN reaction causing severe rash involving mucus membranes or skin necrosis: No Has patient had a PCN reaction that required hospitalization No Has patient had a PCN reaction occurring within the last 10 years: No If all of the above answers are "NO", then may proceed with Cephalosporin use.       Thank you for allowing pharmacy to be a part of this patient's care.  [name], [name] 06/20/2016 7:26 PM

## 2016-06-20 NOTE — Op Note (Signed)
[facility] Patient Name: [name] Procedure Date: 06/20/2016 3:38 PM MRN: [id number] Date of Birth: 03/10/1940 Attending MD: Lionel December , MD CSN: [id number] Age: 76 Admit Type: Outpatient Procedure:                Upper GI endoscopy Indications:              Anemia secondary to chronic blood loss Providers:                Lionel December, MD, Brain Hilts RN, RN, Dyann Ruddle Referring MD:             Shon Hale, MD Medicines:                Propofol per Anesthesia Complications:            No immediate complications. Estimated Blood Loss:     Estimated blood loss: none. Procedure:                Pre-Anesthesia Assessment:                           - Prior to the procedure, a History and Physical                            was performed, and patient medications and                            allergies were reviewed. The patient's tolerance of                            previous anesthesia was also reviewed. The risks                            and benefits of the procedure and the sedation                            options and risks were discussed with the patient.                            All questions were answered, and informed consent                            was obtained. Prior Anticoagulants: The patient has                            taken no previous anticoagulant or antiplatelet                            agents. ASA Grade Assessment: III - A patient with                            severe systemic disease. After reviewing the risks                            and benefits, the patient was deemed in  satisfactory condition to undergo the procedure.                           After obtaining informed consent, the endoscope was                            passed under direct vision. Throughout the                            procedure, the patient's blood pressure, pulse, and                            oxygen saturations were  monitored continuously. The                            [id number] ([id number]) scope was introduced through the                            mouth, and advanced to the second part of duodenum.                            The upper GI endoscopy was accomplished without                            difficulty. The patient tolerated the procedure                            well. Scope In: 3:40:30 PM Scope Out: 3:45:19 PM Total Procedure Duration: 0 hours 4 minutes 49 seconds  Findings:      The examined esophagus was normal.      The Z-line was regular and was found 37 cm from the incisors.      A large hiatal hernia was present.      One non-bleeding cratered gastric ulcer with no stigmata of bleeding was       found in the gastric body and on the lesser curvature of the stomach.       The lesion was 12 mm in largest dimension.      The exam of the stomach was otherwise normal.      The duodenal bulb and second portion of the duodenum were normal. Impression:               - Normal esophagus.                           - Z-line regular, 37 cm from the incisors.                           - Large hiatal hernia.                           - Non-bleeding gastric ulcer with no stigmata of                            bleeding.                           -  Normal duodenal bulb and second portion of the                            duodenum.                           - No specimens collected. Moderate Sedation:      Per Anesthesia Care Recommendation:           - Return patient to hospital ward for ongoing care.                           - Continue present medications.                           - continue pantoprazole at 40 mg by mouth twice a                            day.                           - H. pylori serology.                           - Ferrous sulfate 325 mg by mouth daily.                           - Follow-up EGD in 3 months.                           - Cardiac diet today. Procedure  Code(s):        --- Professional ---                           [phone], Esophagogastroduodenoscopy, flexible,                            transoral; diagnostic, including collection of                            specimen(s) by brushing or washing, when performed                            (separate procedure) Diagnosis Code(s):        --- Professional ---                           K44.9, Diaphragmatic hernia without obstruction or                            gangrene                           K25.9, Gastric ulcer, unspecified as acute or                            chronic, without hemorrhage or perforation  D50.0, Iron deficiency anemia secondary to blood                            loss (chronic) CPT copyright 2016 American Medical Association. All rights reserved. The codes documented in this report are preliminary and upon coder review may  be revised to meet current compliance requirements. Lionel DecemberNajeeb Rehman, MD Lionel DecemberNajeeb Rehman, MD 06/20/2016 4:41:25 PM This report has been signed electronically. Number of Addenda: 0

## 2016-06-20 NOTE — Progress Notes (Signed)
Spoke to patient about the importance of her IV, informed her that I would be placing an IV,and it was per MD orders that she had a IV,she consented. IV discontinued to right upper arm, catheter intact. New IV placed to left hand, she tolerated procedure well. Will continue to monitor patient.

## 2016-06-20 NOTE — Care Management Important Message (Signed)
Important Message  Patient Details  Name: [name] MRN: [id number] Date of Birth: 11/30/1940   Medicare Important Message Given:  Yes    Malcolm MetroChildress, Jayten Gabbard Demske, RN 06/20/2016, 11:57 AM

## 2016-06-20 NOTE — Progress Notes (Signed)
Pt refusing to drink anymore [email or web address] adv that MD ordered for her to drink the whole jug. Explained she was having a colonscopy in the am. She yelled "I don't care! I will shove it up your tail if you dont leave me alone!" I informed MD. He placed order for Dulcolax 10mg  and adv I encourage her to drink throughout night. If she doesn't drink anymore than more can be offered in the am. Will continue to offer.

## 2016-06-20 NOTE — Anesthesia Preprocedure Evaluation (Signed)
Anesthesia Evaluation  Patient identified by MRN, date of birth, ID band Patient awake    Reviewed: Allergy & Precautions, NPO status , Patient's Chart, lab work & pertinent test results  Airway Mallampati: II  TM Distance: >3 FB     Dental  (+) Edentulous Upper, Edentulous Lower   Pulmonary Current Smoker,    breath sounds clear to auscultation       Cardiovascular hypertension, Pt. on medications  Rhythm:Regular Rate:Normal     Neuro/Psych    GI/Hepatic GI bleed    Endo/Other  diabetes, Type 2, Oral Hypoglycemic Agents  Renal/GU Renal disease     Musculoskeletal   Abdominal   Peds  Hematology  (+) anemia ,   Anesthesia Other Findings   Reproductive/Obstetrics                             Anesthesia Physical Anesthesia Plan  ASA: III  Anesthesia Plan: MAC   Post-op Pain Management:    Induction: Intravenous  Airway Management Planned: Simple Face Mask  Additional Equipment:   Intra-op Plan:   Post-operative Plan:   Informed Consent: I have reviewed the patients History and Physical, chart, labs and discussed the procedure including the risks, benefits and alternatives for the proposed anesthesia with the patient or authorized representative who has indicated his/her understanding and acceptance.     Plan Discussed with:   Anesthesia Plan Comments:         Anesthesia Quick Evaluation

## 2016-06-20 NOTE — Transfer of Care (Signed)
Immediate Anesthesia Transfer of Care Note  Patient: [name]  Procedure(s) Performed: Procedure(s): COLONOSCOPY WITH PROPOFOL (N/A) ESOPHAGOGASTRODUODENOSCOPY (EGD) WITH PROPOFOL (N/A)  Patient Location: PACU  Anesthesia Type:MAC  Level of Consciousness: awake and patient cooperative  Airway & Oxygen Therapy: Patient Spontanous Breathing and Patient connected to face mask oxygen  Post-op Assessment: Report given to RN, Post -op Vital signs reviewed and stable and Patient moving all extremities  Post vital signs: Reviewed and stable  Last Vitals:  Vitals:   06/20/16 1455 06/20/16 1500  BP:  (!) 161/78  Pulse:    Resp: (!) 22 (!) 47  Temp:      Last Pain:  Vitals:   06/20/16 1355  TempSrc: Oral  PainSc:          Complications: No apparent anesthesia complications

## 2016-06-20 NOTE — Progress Notes (Signed)
Blood for h-pylori drawn and sent to lab for results.

## 2016-06-20 NOTE — Progress Notes (Signed)
Patient Demographics:    [name], is a 76 y.o. female, DOB - 14-Dec-1940, XLK:[id number]  Admit date - 06/18/2016   Admitting Physician Quintus Premo Mariea Clonts, MD  Outpatient Primary MD for the patient is Pearson Grippe, MD  LOS - 2   Chief Complaint  Patient presents with  . Abnormal Lab        Subjective:    [name] today has no fevers, no emesis,  No chest pain,  No new complaints, She is reluctant to drink colonoscopy prep,   Assessment  & Plan :    Principal Problem:   GI (gastrointestinal bleed) Active Problems:   HTN (hypertension)   Diabetes (HCC)   Anemia   Anemia due to acute blood loss   CKD (chronic kidney disease), stage III   1)Symptomatic Anemia- suspect acute blood loss anemia,hemoglobin is 9.8 from 6.9 on admission after transfusion of 2 units of packed cells on 06/18/2016.   Patient had EGD on 03/03/2016 with findings of erosive gastritis and GERD type symptoms, patient declined colonoscopy at that time. GI consult appreciated, patient is for endoluminal evaluation on 06/20/2016 with possible colonoscopy and EGD .  patient is agreeable to undergo colonoscopy this time around . Follow H&H and transfuse as clinically indicated  2)DM- Allow some permissive Hyperglycemia rather than risk life-threatening hypoglycemia in a patient with unreliable oral intake due to acute GI bleed. Hold glipizide/metformin.Use Novolog/Humalog Sliding scale insulin with Accu-Cheks/Fingersticks as ordered  3)CKDstage III- renal function appears to be at baseline at this time, avoid nephrotoxic agents  Code Status : Full   Disposition Plan  : home  Consults  :  Gi   DVT Prophylaxis  :   SCDs   Lab Results  Component Value Date   PLT 322 06/20/2016    Inpatient Medications  Scheduled Meds: . lidocaine  15 mL Mouth/Throat Once  . lidocaine  15 mL Mouth/Throat Once  . [MAR  Hold] pantoprazole (PROTONIX) IV  40 mg Intravenous BID  . [MAR Hold] senna  1 tablet Oral BID  . [MAR Hold] sodium chloride flush  3 mL Intravenous Q12H   Continuous Infusions: . [MAR Hold] sodium chloride    . sodium chloride 50 mL/hr at 06/20/16 0820  . lactated ringers 75 mL/hr at 06/20/16 1406   PRN Meds:.[MAR Hold] sodium chloride, [MAR Hold] acetaminophen **OR** [MAR Hold] acetaminophen, [MAR Hold] albuterol, [MAR Hold] insulin aspart, [MAR Hold] ondansetron **OR** [MAR Hold] ondansetron (ZOFRAN) IV, [MAR Hold] polyethylene glycol, [MAR Hold] sodium chloride flush, [MAR Hold] traZODone    Anti-infectives    None        Objective:   Vitals:   06/20/16 0655 06/20/16 0913 06/20/16 1316 06/20/16 1355  BP: 128/62  139/71   Pulse: 71  87 85  Resp: 18  20   Temp: 98.1 F (36.7 C)  97.5 F (36.4 C) 98.4 F (36.9 C)  TempSrc: Oral  Oral Oral  SpO2: 96% 93% 97%   Weight:      Height:        Wt Readings from Last 3 Encounters:  06/18/16 80.3 kg (177 lb)  02/29/16 80.6 kg (177 lb 12.8 oz)  09/27/13 82.1 kg (181 lb)     Intake/Output Summary (Last 24  hours) at 06/20/16 1442 Last data filed at 06/20/16 1300  Gross per 24 hour  Intake          1003.33 ml  Output              200 ml  Net           803.33 ml     Physical Exam  Gen:- Awake Alert,  In no apparent distress  HEENT:- [place].AT, No sclera icterus Neck-Supple Neck,No JVD,.  Lungs-  CTAB  CV- S1, S2 normal Abd-  +ve B.Sounds, Abd Soft, No tenderness,    Extremity/Skin:- No  edema,       Data Review:   Micro Results No results found for this or any previous visit (from the past 240 hour(s)).  Radiology Reports No results found.   CBC  Recent Labs Lab 06/18/16 1240 06/19/16 0554 06/19/16 1401 06/20/16 0619  WBC 7.3 7.7  --  8.2  HGB 6.9* 10.1* 9.6* 9.8*  HCT 22.6* 32.8* 30.0* 31.0*  PLT 268 323  --  322  MCV 82.8 83.0  --  82.2  MCH 25.3* 25.6*  --  26.0  MCHC 30.5 30.8  --  31.6  RDW  16.4* 15.7*  --  15.9*  LYMPHSABS 2.0  --   --   --   MONOABS 0.5  --   --   --   EOSABS 0.3  --   --   --   BASOSABS 0.0  --   --   --     Chemistries   Recent Labs Lab 06/18/16 1240 06/19/16 0554 06/20/16 0619  NA 140 138 138  K 4.7 4.1 3.7  CL 106 100* 102  CO2 25 28 27   GLUCOSE 74 130* 160*  BUN 24* 21* 12  CREATININE 1.42* 1.18* 1.01*  CALCIUM 8.6* 9.0 9.0  AST 18  --   --   ALT 10*  --   --   ALKPHOS 62  --   --   BILITOT 0.2*  --   --    ------------------------------------------------------------------------------------------------------------------ No results for input(s): CHOL, HDL, LDLCALC, TRIG, CHOLHDL, LDLDIRECT in the last 72 hours.  Lab Results  Component Value Date   HGBA1C 7.1 (H) 02/29/2016   ------------------------------------------------------------------------------------------------------------------ No results for input(s): TSH, T4TOTAL, T3FREE, THYROIDAB in the last 72 hours.  Invalid input(s): FREET3 ------------------------------------------------------------------------------------------------------------------ No results for input(s): VITAMINB12, FOLATE, FERRITIN, TIBC, IRON, RETICCTPCT in the last 72 hours.  Coagulation profile No results for input(s): INR, PROTIME in the last 168 hours.  No results for input(s): DDIMER in the last 72 hours.  Cardiac Enzymes No results for input(s): CKMB, TROPONINI, MYOGLOBIN in the last 168 hours.  Invalid input(s): CK ------------------------------------------------------------------------------------------------------------------ No results found for: BNP   Lanay Zinda M.D on 06/20/2016 at 2:42 PM  Between 7am to 7pm - Pager - [phone]  After 7pm go to [email or web address] - password [email or web address]  [facility] -  Office  [phone]   Voice Recognition Reubin Milan/Dragon dictation system was used to create this note, attempts have been made to correct errors. Please contact the author with  questions and/or clarifications.

## 2016-06-20 NOTE — Anesthesia Postprocedure Evaluation (Signed)
Anesthesia Post Note  Patient: [name]  Procedure(s) Performed: Procedure(s) (LRB): COLONOSCOPY WITH PROPOFOL (N/A) ESOPHAGOGASTRODUODENOSCOPY (EGD) WITH PROPOFOL (N/A)  Patient location during evaluation: PACU Anesthesia Type: MAC Level of consciousness: awake Pain management: pain level controlled Vital Signs Assessment: post-procedure vital signs reviewed and stable Respiratory status: spontaneous breathing, nonlabored ventilation and respiratory function stable Cardiovascular status: blood pressure returned to baseline Postop Assessment: no signs of nausea or vomiting Anesthetic complications: no     Last Vitals:  Vitals:   06/20/16 1455 06/20/16 1500  BP:  (!) 161/78  Pulse:    Resp: (!) 22 (!) 47  Temp:      Last Pain:  Vitals:   06/20/16 1355  TempSrc: Oral  PainSc:                  Nereyda Bowler J

## 2016-06-21 DIAGNOSIS — K254 Chronic or unspecified gastric ulcer with hemorrhage: Secondary | ICD-10-CM

## 2016-06-21 LAB — CBC
HEMATOCRIT: 31.3 % — AB (ref 36.0–46.0)
HEMOGLOBIN: 9.5 g/dL — AB (ref 12.0–15.0)
MCH: 25.5 pg — ABNORMAL LOW (ref 26.0–34.0)
MCHC: 30.4 g/dL (ref 30.0–36.0)
MCV: 84.1 fL (ref 78.0–100.0)
Platelets: 309 10*3/uL (ref 150–400)
RBC: 3.72 MIL/uL — AB (ref 3.87–5.11)
RDW: 16.3 % — ABNORMAL HIGH (ref 11.5–15.5)
WBC: 15.6 10*3/uL — ABNORMAL HIGH (ref 4.0–10.5)

## 2016-06-21 LAB — BPAM RBC
BLOOD PRODUCT EXPIRATION DATE: 201805232359
Blood Product Expiration Date: 201805242359
ISSUE DATE / TIME: 201805232055
ISSUE DATE / TIME: 201805231658
UNIT TYPE AND RH: 6200
Unit Type and Rh: 6200

## 2016-06-21 LAB — TYPE AND SCREEN
ABO/RH(D): A POS
Antibody Screen: NEGATIVE
UNIT DIVISION: 0
Unit division: 0

## 2016-06-21 LAB — GLUCOSE, CAPILLARY
GLUCOSE-CAPILLARY: 239 mg/dL — AB (ref 65–99)
GLUCOSE-CAPILLARY: 145 mg/dL — AB (ref 65–99)
GLUCOSE-CAPILLARY: 122 mg/dL — AB (ref 65–99)
Glucose-Capillary: 261 mg/dL — ABNORMAL HIGH (ref 65–99)

## 2016-06-21 MED ORDER — GUAIFENESIN ER 600 MG PO TB12
600.0000 mg | ORAL_TABLET | Freq: Two times a day (BID) | ORAL | Status: DC
  Administered 2016-06-21 – 2016-06-23 (×4): 600 mg via ORAL
  Filled 2016-06-21 (×5): qty 1

## 2016-06-21 MED ORDER — INSULIN ASPART 100 UNIT/ML ~~LOC~~ SOLN
0.0000 [IU] | Freq: Three times a day (TID) | SUBCUTANEOUS | Status: DC
  Administered 2016-06-21: 3 [IU] via SUBCUTANEOUS
  Administered 2016-06-22: 2 [IU] via SUBCUTANEOUS
  Administered 2016-06-23: 3 [IU] via SUBCUTANEOUS

## 2016-06-21 MED ORDER — GLIPIZIDE 5 MG PO TABS
5.0000 mg | ORAL_TABLET | Freq: Two times a day (BID) | ORAL | Status: DC
  Administered 2016-06-21 – 2016-06-23 (×5): 5 mg via ORAL
  Filled 2016-06-21 (×5): qty 1

## 2016-06-21 NOTE — Addendum Note (Signed)
Addendum  created 06/21/16 0954 by Earleen NewportAdams, Amy A, CRNA   Sign clinical note

## 2016-06-21 NOTE — Progress Notes (Signed)
Patient awake and alert. Voice no complaints.  Tolerating diet. No obvious bleeding. Discussed with nursing staff.  Discussed with Dr. Karilyn Cotaehman yesterday.   Vital signs in last 24 hours: Temp:  [97.5 F (36.4 C)-103.2 F (39.6 C)] 98.8 F (37.1 C) (05/26 16100613) Pulse Rate:  [76-114] 76 (05/26 1112) Resp:  [15-47] 22 (05/26 0613) BP: (127-184)/(55-87) 127/55 (05/26 0613) SpO2:  [85 %-97 %] 93 % (05/26 1112) Last BM Date: 06/19/16 General:    pleasant and cooperative in NAD Abdomen:  Nondistended. Normal bowel sounds, without guarding, and without rebound.  No mass or organomegaly. Extremities:  Without clubbing or edema.    Intake/Output from previous day: 05/25 0701 - 05/26 0700 In: 1090 [P.O.:240; I.V.:800; IV Piggyback:50] Out: 200 [Urine:200] Intake/Output this shift: Total I/O In: 240 [P.O.:240] Out: -   Lab Results:  Recent Labs  06/20/16 0619 06/20/16 2013 06/21/16 0646  WBC 8.2 7.8 15.6*  HGB 9.8* 9.7* 9.5*  HCT 31.0* 30.7* 31.3*  PLT 322 300 309   BMET  Recent Labs  06/18/16 1240 06/19/16 0554 06/20/16 0619  NA 140 138 138  K 4.7 4.1 3.7  CL 106 100* 102  CO2 25 28 27   GLUCOSE 74 130* 160*  BUN 24* 21* 12  CREATININE 1.42* 1.18* 1.01*  CALCIUM 8.6* 9.0 9.0   Impression:  Pleasant 76 year old lady with chronic GI blood loss. 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 H. pylori serologies are pending.  Leukocytosis of uncertain significance.    Recommendations: Continue twice a day PPI therapy.  Follow up on H. pylori serologies. Outpatient follow-up with Dr. Karilyn Cotaehman as is being planned.  Repeat EGD in 3 months.

## 2016-06-21 NOTE — Progress Notes (Signed)
Patient Demographics:    [name] [name], is a 76 y.o. female, DOB - [birth year], AVW:[id number]  Admit date - 06/18/2016   Admitting Physician Tekeya Geffert Mariea ClontsEmokpae, MD  Outpatient Primary MD for the patient is Pearson GrippeKim, James, MD  LOS - 3   Chief Complaint  Patient presents with  . Abnormal Lab        Subjective:    [name] Cravens today has no fevers, no emesis,  No chest pain,  No further fevers, no further hypoxia, eating and drinking well   Assessment  & Plan :    Principal Problem:   GI (gastrointestinal bleed) Active Problems:   HTN (hypertension)   Diabetes (HCC)   Anemia   Anemia due to acute blood loss   CKD (chronic kidney disease), stage III   1)Lt Sided Aspiration PNA- post anesthesia/endoscopy aspiration related left-sided pneumonia while patient was laying in the left lateral decubitus position for endoscopic evaluation , no further fevers no further hypoxia, white count is up to 15,000, continue Zosyn and bronchodilators . No evidence of frank penicillin allergy, consider possible discharge home on Augmentin if no "real" penicillin allergy, otherwise Levaquin  2)Symptomatic Anemia- suspect acute blood loss anemia,hemoglobin is 9.5 from 6.9 on admission after transfusion of 2 units of packed cells on 06/18/2016. Patient had EGD on  06/20/2016 with Cameron's Ulcer and large hiatal hernia and colonoscopy on 06/20/16 with hemorrhoids , twice a day PPI and repeat EGD in 3 months advised   2)DM- restart glipizide, Use Novolog/Humalog Sliding scale insulin with Accu-Cheks/Fingersticks as ordered  3)CKDstage III- renal function appears to be at baseline at this time, avoid nephrotoxic agents  Code Status:Full   Disposition Plan:home  Consults :Gi   DVT Prophylaxis: SCDs  Lab Results  Component Value Date   PLT 309 06/21/2016    Inpatient Medications  Scheduled  Meds: . ferrous sulfate  325 mg Oral Q breakfast  . glipiZIDE  5 mg Oral BID AC  . insulin aspart  0-9 Units Subcutaneous TID WC  . nystatin-triamcinolone   Topical BID  . pantoprazole  40 mg Oral BID AC  . senna  1 tablet Oral BID  . sodium chloride flush  3 mL Intravenous Q12H   Continuous Infusions: . sodium chloride    . sodium chloride 10 mL/hr at 06/21/16 0813  . piperacillin-tazobactam (ZOSYN)  IV 3.375 g (06/21/16 1335)   PRN Meds:.sodium chloride, acetaminophen **OR** acetaminophen, albuterol, insulin aspart, ondansetron **OR** ondansetron (ZOFRAN) IV, polyethylene glycol, sodium chloride flush, traZODone    Anti-infectives    Start     Dose/Rate Route Frequency Ordered Stop   06/20/16 1930  piperacillin-tazobactam (ZOSYN) IVPB 3.375 g     3.375 g 12.5 mL/hr over 240 Minutes Intravenous Every 8 hours 06/20/16 1925          Objective:   Vitals:   06/21/16 0100 06/21/16 0613 06/21/16 1112 06/21/16 1500  BP:  (!) 127/55  (!) 115/50  Pulse:  88 76 77  Resp:  (!) 22  18  Temp: 98.4 F (36.9 C) 98.8 F (37.1 C)  99.5 F (37.5 C)  TempSrc: Oral Oral  Oral  SpO2: 96% 90% 93% 92%  Weight:      Height:  Wt Readings from Last 3 Encounters:  06/18/16 80.3 kg (177 lb)  02/29/16 80.6 kg (177 lb 12.8 oz)  09/27/13 82.1 kg (181 lb)     Intake/Output Summary (Last 24 hours) at 06/21/16 1550 Last data filed at 06/21/16 1405  Gross per 24 hour  Intake              920 ml  Output              200 ml  Net              720 ml     Physical Exam  Gen:- Awake Alert,  In no apparent distress  HEENT:- [place].AT, No sclera icterus Neck-Supple Neck,No JVD,.  Lungs-  scattered rhonchi on the left with diminished breath sounds CV- S1, S2 normal Abd-  +ve B.Sounds, Abd Soft, No tenderness,    Extremity/Skin:- No  edema,       Data Review:   Micro Results Recent Results (from the past 240 hour(s))  Culture, blood (routine x 2)     Status: None (Preliminary result)     Collection Time: 06/20/16  8:13 PM  Result Value Ref Range Status   Specimen Description LEFT ANTECUBITAL  Final   Special Requests   Final    BOTTLES DRAWN AEROBIC AND ANAEROBIC Blood Culture adequate volume   Culture NO GROWTH < 24 HOURS  Final   Report Status PENDING  Incomplete  Culture, blood (routine x 2)     Status: None (Preliminary result)   Collection Time: 06/20/16  8:13 PM  Result Value Ref Range Status   Specimen Description BLOOD LEFT HAND  Final   Special Requests   Final    BOTTLES DRAWN AEROBIC AND ANAEROBIC Blood Culture adequate volume   Culture NO GROWTH < 24 HOURS  Final   Report Status PENDING  Incomplete    Radiology Reports Dg Abd Acute W/chest  Result Date: 06/20/2016 CLINICAL DATA:  Fevers EXAM: DG ABDOMEN ACUTE W/ 1V CHEST COMPARISON:  09/27/2013 FINDINGS: Cardiac shadow is enlarged. Diffuse left-sided infiltrate is noted occupying the mid and lower lung zone. The right lung is clear. No free air is seen. Scattered large and small bowel gas is noted. No obstructive changes are noted. No abnormal calcifications are seen. A hiatal hernia is noted. No bony abnormality is noted. IMPRESSION: Significant left-sided infiltrate. No acute abdominal abnormality noted. Electronically Signed   By: Alcide Clever M.D.   On: 06/20/2016 21:02     CBC  Recent Labs Lab 06/18/16 1240 06/19/16 0554 06/19/16 1401 06/20/16 0619 06/20/16 2013 06/21/16 0646  WBC 7.3 7.7  --  8.2 7.8 15.6*  HGB 6.9* 10.1* 9.6* 9.8* 9.7* 9.5*  HCT 22.6* 32.8* 30.0* 31.0* 30.7* 31.3*  PLT 268 323  --  322 300 309  MCV 82.8 83.0  --  82.2 83.0 84.1  MCH 25.3* 25.6*  --  26.0 26.2 25.5*  MCHC 30.5 30.8  --  31.6 31.6 30.4  RDW 16.4* 15.7*  --  15.9* 16.0* 16.3*  LYMPHSABS 2.0  --   --   --  0.7  --   MONOABS 0.5  --   --   --  0.3  --   EOSABS 0.3  --   --   --  0.1  --   BASOSABS 0.0  --   --   --  0.0  --     Chemistries   Recent Labs Lab 06/18/16 1240 06/19/16 0554  06/20/16 0619  NA 140 138 138  K 4.7 4.1 3.7  CL 106 100* 102  CO2 25 28 27   GLUCOSE 74 130* 160*  BUN 24* 21* 12  CREATININE 1.42* 1.18* 1.01*  CALCIUM 8.6* 9.0 9.0  AST 18  --   --   ALT 10*  --   --   ALKPHOS 62  --   --   BILITOT 0.2*  --   --    ------------------------------------------------------------------------------------------------------------------ No results for input(s): CHOL, HDL, LDLCALC, TRIG, CHOLHDL, LDLDIRECT in the last 72 hours.  Lab Results  Component Value Date   HGBA1C 7.1 (H) 02/29/2016   ------------------------------------------------------------------------------------------------------------------ No results for input(s): TSH, T4TOTAL, T3FREE, THYROIDAB in the last 72 hours.  Invalid input(s): FREET3 ------------------------------------------------------------------------------------------------------------------ No results for input(s): VITAMINB12, FOLATE, FERRITIN, TIBC, IRON, RETICCTPCT in the last 72 hours.  Coagulation profile No results for input(s): INR, PROTIME in the last 168 hours.  No results for input(s): DDIMER in the last 72 hours.  Cardiac Enzymes No results for input(s): CKMB, TROPONINI, MYOGLOBIN in the last 168 hours.  Invalid input(s): CK ------------------------------------------------------------------------------------------------------------------ No results found for: BNP   Earle Troiano M.D on 06/21/2016 at 3:50 PM  Between 7am to 7pm - Pager - [phone]  After 7pm go to [email or web address] - password [email or web address]  [facility] -  Office  [phone]   Voice Recognition Reubin Milan dictation system was used to create this note, attempts have been made to correct errors. Please contact the author with questions and/or clarifications.

## 2016-06-21 NOTE — Anesthesia Postprocedure Evaluation (Signed)
Anesthesia Post Note  Patient: [name]  Procedure(s) Performed: Procedure(s) (LRB): COLONOSCOPY WITH PROPOFOL (N/A) ESOPHAGOGASTRODUODENOSCOPY (EGD) WITH PROPOFOL (N/A)  Patient location during evaluation: Nursing Unit Anesthesia Type: MAC Level of consciousness: awake and alert and oriented (At baseline) Pain management: pain level controlled Vital Signs Assessment: post-procedure vital signs reviewed and stable Respiratory status: spontaneous breathing Cardiovascular status: stable Postop Assessment: no signs of nausea or vomiting Anesthetic complications: no     Last Vitals:  Vitals:   06/21/16 0100 06/21/16 0613  BP:  (!) 127/55  Pulse:  88  Resp:  (!) 22  Temp: 36.9 C 37.1 C    Last Pain:  Vitals:   06/21/16 0800  TempSrc:   PainSc: 0-No pain                 [name] A

## 2016-06-22 LAB — GLUCOSE, CAPILLARY
GLUCOSE-CAPILLARY: 127 mg/dL — AB (ref 65–99)
GLUCOSE-CAPILLARY: 75 mg/dL (ref 65–99)
GLUCOSE-CAPILLARY: 90 mg/dL (ref 65–99)
Glucose-Capillary: 193 mg/dL — ABNORMAL HIGH (ref 65–99)

## 2016-06-22 LAB — BASIC METABOLIC PANEL
Anion gap: 8 (ref 5–15)
BUN: 23 mg/dL — AB (ref 6–20)
CHLORIDE: 102 mmol/L (ref 101–111)
CO2: 27 mmol/L (ref 22–32)
CREATININE: 1.4 mg/dL — AB (ref 0.44–1.00)
Calcium: 8.3 mg/dL — ABNORMAL LOW (ref 8.9–10.3)
GFR calc non Af Amer: 36 mL/min — ABNORMAL LOW (ref >60)
GFR, EST AFRICAN AMERICAN: 42 mL/min — AB (ref >60)
Glucose, Bld: 135 mg/dL — ABNORMAL HIGH (ref 65–99)
Potassium: 4.2 mmol/L (ref 3.5–5.1)
SODIUM: 137 mmol/L (ref 135–145)

## 2016-06-22 LAB — CBC
HCT: 29.3 % — ABNORMAL LOW (ref 36.0–46.0)
Hemoglobin: 9.1 g/dL — ABNORMAL LOW (ref 12.0–15.0)
MCH: 26.2 pg (ref 26.0–34.0)
MCHC: 31.1 g/dL (ref 30.0–36.0)
MCV: 84.4 fL (ref 78.0–100.0)
PLATELETS: 271 10*3/uL (ref 150–400)
RBC: 3.47 MIL/uL — AB (ref 3.87–5.11)
RDW: 16.7 % — AB (ref 11.5–15.5)
WBC: 13.1 10*3/uL — AB (ref 4.0–10.5)

## 2016-06-22 MED ORDER — SODIUM CHLORIDE 0.9 % IV SOLN
INTRAVENOUS | Status: DC
  Administered 2016-06-22 – 2016-06-23 (×2): via INTRAVENOUS

## 2016-06-22 NOTE — Progress Notes (Signed)
Patient Demographics:    [name], is a 76 y.o. female, DOB - 1940/10/16, WUJ:[id number]  Admit date - 06/18/2016   Admitting Physician Alic Hilburn Mariea Clonts, MD  Outpatient Primary MD for the patient is Pearson Grippe, MD  LOS - 4   Chief Complaint  Patient presents with  . Abnormal Lab        Subjective:    [name] today has no fevers, no emesis,  No chest pain,  Eating ok   Assessment  & Plan :    Principal Problem:   GI (gastrointestinal bleed) Active Problems:   HTN (hypertension)   Diabetes (HCC)   Anemia   Anemia due to acute blood loss   CKD (chronic kidney disease), stage III  1)Aki on CKD - Suspect secondary to dehydration in a patient who is post colonoscopy and also with resolving fevers, give IV fluids at 75 mL an hour, recheck renal parameters, at baseline patient has secured the stage III,  continue to avoid nephrotoxic agents  2)Lt Sided Aspiration PNA- post anesthesia/endoscopy aspiration related left-sided pneumonia while patient was laying in the left lateral decubitus position for endoscopic evaluation , clinically improving, no further fevers no further hypoxia, white count is down to 13,000 from 15,000, continue Zosyn and bronchodilators . No evidence of frank penicillin allergy, consider possible discharge home on Augmentin if no "real" penicillin allergy, otherwise Levaquin. Repeat chest x-ray on 06/23/2016  3)Symptomatic Anemia- suspect acute blood loss anemia,hemoglobin is 9.1 from 6.9 on admissionafter transfusion of 2 units of packed cells on 06/18/2016. 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 Suspect that H&H may drop further in a.m. due to hydration for AKI as above in #1  4)DM- c/n glipizide, Use Novolog/Humalog Sliding scale  insulin with Accu-Cheks/Fingersticks as ordered  5)Dispo- possible discharge home in 1-2 days if continues to improve especially if renal function and respiratory status continued to improve and H&H is stable  Code Status:Full   Disposition Plan:home  Consults :Gi   DVT Prophylaxis: SCDs   Lab Results  Component Value Date   PLT 271 06/22/2016    Inpatient Medications  Scheduled Meds: . ferrous sulfate  325 mg Oral Q breakfast  . glipiZIDE  5 mg Oral BID AC  . guaiFENesin  600 mg Oral BID  . insulin aspart  0-9 Units Subcutaneous TID WC  . nystatin-triamcinolone   Topical BID  . pantoprazole  40 mg Oral BID AC  . senna  1 tablet Oral BID  . sodium chloride flush  3 mL Intravenous Q12H   Continuous Infusions: . sodium chloride    . sodium chloride 10 mL/hr at 06/21/16 0813  . piperacillin-tazobactam (ZOSYN)  IV Stopped (06/22/16 0916)   PRN Meds:.sodium chloride, acetaminophen **OR** acetaminophen, albuterol, insulin aspart, ondansetron **OR** ondansetron (ZOFRAN) IV, polyethylene glycol, sodium chloride flush, traZODone    Anti-infectives    Start     Dose/Rate Route Frequency Ordered Stop   06/20/16 1930  piperacillin-tazobactam (ZOSYN) IVPB 3.375 g     3.375 g 12.5 mL/hr over 240 Minutes Intravenous Every 8 hours 06/20/16 1925  Objective:   Vitals:   06/21/16 1500 06/21/16 2100 06/22/16 0500 06/22/16 1423  BP: (!) 115/50 (!) 124/53 (!) 106/44 132/60  Pulse: 77 84 81 75  Resp: 18 18 18 18   Temp: 99.5 F (37.5 C) 100 F (37.8 C) 99.5 F (37.5 C) 99 F (37.2 C)  TempSrc: Oral Oral Oral Oral  SpO2: 92% 92% 91% 96%  Weight:      Height:        Wt Readings from Last 3 Encounters:  06/18/16 80.3 kg (177 lb)  02/29/16 80.6 kg (177 lb 12.8 oz)  09/27/13 82.1 kg (181 lb)     Intake/Output Summary (Last 24 hours) at 06/22/16 1431 Last data filed at 06/22/16 1424  Gross per 24 hour  Intake              720 ml  Output               350 ml  Net              370 ml     Physical Exam  Gen:- Awake Alert,  In no apparent distress  HEENT:- [place].AT, No sclera icterus Neck-Supple Neck,No JVD,.  Lungs-  scattered rhonchi on the left, no wheezing CV- S1, S2 normal Abd-  +ve B.Sounds, Abd Soft, No tenderness,    Extremity/Skin:- No  edema,       Data Review:   Micro Results Recent Results (from the past 240 hour(s))  Culture, blood (routine x 2)     Status: None (Preliminary result)   Collection Time: 06/20/16  8:13 PM  Result Value Ref Range Status   Specimen Description LEFT ANTECUBITAL  Final   Special Requests   Final    BOTTLES DRAWN AEROBIC AND ANAEROBIC Blood Culture adequate volume   Culture NO GROWTH 2 DAYS  Final   Report Status PENDING  Incomplete  Culture, blood (routine x 2)     Status: None (Preliminary result)   Collection Time: 06/20/16  8:13 PM  Result Value Ref Range Status   Specimen Description BLOOD LEFT HAND  Final   Special Requests   Final    BOTTLES DRAWN AEROBIC AND ANAEROBIC Blood Culture adequate volume   Culture NO GROWTH 2 DAYS  Final   Report Status PENDING  Incomplete    Radiology Reports Dg Abd Acute W/chest  Result Date: 06/20/2016 CLINICAL DATA:  Fevers EXAM: DG ABDOMEN ACUTE W/ 1V CHEST COMPARISON:  09/27/2013 FINDINGS: Cardiac shadow is enlarged. Diffuse left-sided infiltrate is noted occupying the mid and lower lung zone. The right lung is clear. No free air is seen. Scattered large and small bowel gas is noted. No obstructive changes are noted. No abnormal calcifications are seen. A hiatal hernia is noted. No bony abnormality is noted. IMPRESSION: Significant left-sided infiltrate. No acute abdominal abnormality noted. Electronically Signed   By: Alcide CleverMark  Lukens M.D.   On: 06/20/2016 21:02     CBC  Recent Labs Lab 06/18/16 1240 06/19/16 0554 06/19/16 1401 06/20/16 0619 06/20/16 2013 06/21/16 0646 06/22/16 0651  WBC 7.3 7.7  --  8.2 7.8 15.6* 13.1*  HGB 6.9*  10.1* 9.6* 9.8* 9.7* 9.5* 9.1*  HCT 22.6* 32.8* 30.0* 31.0* 30.7* 31.3* 29.3*  PLT 268 323  --  322 300 309 271  MCV 82.8 83.0  --  82.2 83.0 84.1 84.4  MCH 25.3* 25.6*  --  26.0 26.2 25.5* 26.2  MCHC 30.5 30.8  --  31.6 31.6 30.4 31.1  RDW  16.4* 15.7*  --  15.9* 16.0* 16.3* 16.7*  LYMPHSABS 2.0  --   --   --  0.7  --   --   MONOABS 0.5  --   --   --  0.3  --   --   EOSABS 0.3  --   --   --  0.1  --   --   BASOSABS 0.0  --   --   --  0.0  --   --     Chemistries   Recent Labs Lab 06/18/16 1240 06/19/16 0554 06/20/16 0619 06/22/16 0651  NA 140 138 138 137  K 4.7 4.1 3.7 4.2  CL 106 100* 102 102  CO2 25 28 27 27   GLUCOSE 74 130* 160* 135*  BUN 24* 21* 12 23*  CREATININE 1.42* 1.18* 1.01* 1.40*  CALCIUM 8.6* 9.0 9.0 8.3*  AST 18  --   --   --   ALT 10*  --   --   --   ALKPHOS 62  --   --   --   BILITOT 0.2*  --   --   --    ------------------------------------------------------------------------------------------------------------------ No results for input(s): CHOL, HDL, LDLCALC, TRIG, CHOLHDL, LDLDIRECT in the last 72 hours.  Lab Results  Component Value Date   HGBA1C 7.1 (H) 02/29/2016   ------------------------------------------------------------------------------------------------------------------ No results for input(s): TSH, T4TOTAL, T3FREE, THYROIDAB in the last 72 hours.  Invalid input(s): FREET3 ------------------------------------------------------------------------------------------------------------------ No results for input(s): VITAMINB12, FOLATE, FERRITIN, TIBC, IRON, RETICCTPCT in the last 72 hours.  Coagulation profile No results for input(s): INR, PROTIME in the last 168 hours.  No results for input(s): DDIMER in the last 72 hours.  Cardiac Enzymes No results for input(s): CKMB, TROPONINI, MYOGLOBIN in the last 168 hours.  Invalid input(s):  CK ------------------------------------------------------------------------------------------------------------------ No results found for: BNP   Kaitland Lewellyn M.D on 06/22/2016 at 2:31 PM  Between 7am to 7pm - Pager - [phone]  After 7pm go to [email or web address] - password [email or web address]  [facility] -  Office  [phone]   Voice Recognition Reubin [place] dictation system was used to create this note, attempts have been made to correct errors. Please contact the author with questions and/or clarifications.

## 2016-06-23 ENCOUNTER — Inpatient Hospital Stay (HOSPITAL_COMMUNITY): Payer: Medicare Other

## 2016-06-23 LAB — GLUCOSE, CAPILLARY
GLUCOSE-CAPILLARY: 112 mg/dL — AB (ref 65–99)
GLUCOSE-CAPILLARY: 203 mg/dL — AB (ref 65–99)

## 2016-06-23 LAB — BASIC METABOLIC PANEL
ANION GAP: 8 (ref 5–15)
BUN: 19 mg/dL (ref 6–20)
CHLORIDE: 102 mmol/L (ref 101–111)
CO2: 27 mmol/L (ref 22–32)
Calcium: 8.2 mg/dL — ABNORMAL LOW (ref 8.9–10.3)
Creatinine, Ser: 1.29 mg/dL — ABNORMAL HIGH (ref 0.44–1.00)
GFR calc Af Amer: 46 mL/min — ABNORMAL LOW (ref >60)
GFR, EST NON AFRICAN AMERICAN: 40 mL/min — AB (ref >60)
GLUCOSE: 115 mg/dL — AB (ref 65–99)
POTASSIUM: 4 mmol/L (ref 3.5–5.1)
SODIUM: 137 mmol/L (ref 135–145)

## 2016-06-23 LAB — CBC
HCT: 28.2 % — ABNORMAL LOW (ref 36.0–46.0)
Hemoglobin: 8.7 g/dL — ABNORMAL LOW (ref 12.0–15.0)
MCH: 26 pg (ref 26.0–34.0)
MCHC: 30.9 g/dL (ref 30.0–36.0)
MCV: 84.4 fL (ref 78.0–100.0)
PLATELETS: 271 10*3/uL (ref 150–400)
RBC: 3.34 MIL/uL — AB (ref 3.87–5.11)
RDW: 16.9 % — ABNORMAL HIGH (ref 11.5–15.5)
WBC: 10.3 10*3/uL (ref 4.0–10.5)

## 2016-06-23 MED ORDER — PANTOPRAZOLE SODIUM 40 MG PO TBEC: 40.0000 mg | DELAYED_RELEASE_TABLET | Freq: Two times a day (BID) | ORAL | 2 refills | Status: DC

## 2016-06-23 MED ORDER — GUAIFENESIN ER 600 MG PO TB12: 600.0000 mg | ORAL_TABLET | Freq: Two times a day (BID) | ORAL | 0 refills | Status: AC

## 2016-06-23 MED ORDER — AMOXICILLIN-POT CLAVULANATE 875-125 MG PO TABS: 1.0000 | ORAL_TABLET | Freq: Two times a day (BID) | ORAL | 0 refills | Status: AC

## 2016-06-23 MED ORDER — POLYETHYLENE GLYCOL 3350 17 G PO PACK: 17.0000 g | PACK | Freq: Every day | ORAL | 0 refills | Status: DC

## 2016-06-23 MED ORDER — NYSTATIN-TRIAMCINOLONE 100000-0.1 UNIT/GM-% EX CREA: TOPICAL_CREAM | Freq: Two times a day (BID) | CUTANEOUS | 0 refills | Status: AC

## 2016-06-23 NOTE — Progress Notes (Signed)
Pharmacy Antibiotic Note  [name] is a 76 y.o. female admitted on 06/18/2016 with anemia.  Pharmacy has been consulted for zosyn dosing. She had post endoscopy aspiration.  Plan: Zosyn 3.375g IV q8h (4 hour infusion).  Height: 5\' 4"  (162.6 cm) Weight: 177 lb (80.3 kg) IBW/kg (Calculated) : 54.7  Temp (24hrs), Avg:99.3 F (37.4 C), Min:99 F (37.2 C), Max:99.7 F (37.6 C)   Recent Labs Lab 06/18/16 1240 06/19/16 0554 06/20/16 0619 06/20/16 2013 06/21/16 0646 06/22/16 0651 06/23/16 0635  WBC 7.3 7.7 8.2 7.8 15.6* 13.1* 10.3  CREATININE 1.42* 1.18* 1.01*  --   --  1.40* 1.29*    Estimated Creatinine Clearance: 39.8 mL/min (A) (by C-G formula based on SCr of 1.29 mg/dL (H)).    Allergies  Allergen Reactions  . Penicillins Rash    Has patient had a PCN reaction causing immediate rash, facial/tongue/throat swelling, SOB or lightheadedness with hypotension: No Has patient had a PCN reaction causing severe rash involving mucus membranes or skin necrosis: No Has patient had a PCN reaction that required hospitalization No Has patient had a PCN reaction occurring within the last 10 years: No If all of the above answers are "NO", then may proceed with Cephalosporin use.       Thank you for allowing pharmacy to be a part of this patient's care.  Talbert CageSeay, Maruice Pieroni Poteet 06/23/2016 10:09 AM

## 2016-06-23 NOTE — Care Management Important Message (Signed)
Important Message  Patient Details  Name: [name] MRN: [id number] Date of Birth: July 19, 1940   Medicare Important Message Given:  Yes    Siler Mavis, Chrystine OilerSharley Diane, RN 06/23/2016, 2:54 PM

## 2016-06-23 NOTE — Discharge Summary (Signed)
[name], is a 76 y.o. female  DOB March 28, 1940  MRN [id number].  Admission date:  06/18/2016  Admitting Physician  Shon Haleourage Early Ord, MD  Discharge Date:  06/23/2016   Primary MD  Pearson GrippeKim, James, MD  Recommendations for primary care physician for things to follow:   Repeat CBC and BMP in 1 wk  Admission Diagnosis  Anemia, unspecified type [D64.9]   Discharge Diagnosis  Anemia, unspecified type [D64.9]    Principal Problem:   GI (gastrointestinal bleed) Active Problems:   HTN (hypertension)   Diabetes (HCC)   Anemia   Anemia due to acute blood loss   CKD (chronic kidney disease), stage III      Past Medical History:  Diagnosis Date  . Diabetes mellitus without complication (HCC)   . Hypercholesterolemia   . Hypertension     Past Surgical History:  Procedure Laterality Date  . ABDOMINAL HYSTERECTOMY    . ESOPHAGOGASTRODUODENOSCOPY N/A 03/03/2016   Procedure: ESOPHAGOGASTRODUODENOSCOPY (EGD);  Surgeon: Malissa HippoNajeeb U Rehman, MD;  Location: [facility];  Service: Endoscopy;  Laterality: N/A;  . FRACTURE SURGERY         HPI  from the history and physical done on the day of admission:     [name]  is a 76 y.o. female past medical history relevant for CK D and anemia as well as diabetes who was sent over to the ED by PCP due to fatigue and hemoglobin below 7.  In ED patient is found to have a hemoglobin of 6.9, she endorses fatigue and dizziness, please note that in February 2018 patient was admitted to the hospital for syncope due to anemia at that time colonoscopy was advised the patient declined. Her EGD on 03/03/2016 showed some degree of erosive gastritis and GERD.   Patient complains of on some abdominal discomfort, no vomiting or diarrhea,   Patient declined rectal exam  Denies hematochezia, stool was Hemoccult-positive    Hospital Course:    1)Aki on CKD - Improved  with hydration, creatinine is down to 1.2,  Suspect secondary to dehydration in a patient who is post colonoscopy and also with resolving fevers,   at baseline patient has CKD stage III,  continue to avoid nephrotoxic agents  2)Lt Sided Aspiration PNA- post anesthesia/endoscopy aspiration related left-sided pneumonia while patient was laying in the left lateral decubitus position for endoscopic evaluation , clinically and radiologically improved, please see chest x-ray report from 06/23/2016, leukocytosis is resolved, no further fevers, no hypoxia, no shortness of breath .treated with Zosyn and bronchodilators . No evidence of frank penicillin allergy, so discharge home on Augmentin as patient has tolerated Zosyn well for the last few days  3)Symptomatic Anemia- suspect acute blood loss anemia,hemoglobin is 8.7 from 6.9 on admissionafter transfusion of 2 units of packed cells on 06/18/2016. 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  4)DM- c/nglipizide,    Code Status:Full   Disposition Plan:home  Consults :Gi  Discharge Condition: stable  Follow UP  Follow-up Information    Pearson Grippe, MD. Schedule an appointment as soon as possible for a visit in 1 week(s).   Specialty:  Internal Medicine Contact information: [address] [place] [address] [phone]           Consults obtained - Gi  Diet and Activity recommendation:  As advised  Discharge Instructions    1)Avoid ibuprofen/Advil/Aleve/Motrin/Goody Powders/Naproxen/BC powders as these will make you more likely to bleed and can cause stomach ulcers 2) take medications as prescribed 3) follow-up with PCP in 1 week for repeat complete blood count (CBC test) and reevaluation  of your pneumonia/ lungs  Discharge Instructions      Call MD for:  difficulty breathing, headache or visual disturbances    Complete by:  As directed    Call MD for:  persistant dizziness or light-headedness    Complete by:  As directed    Call MD for:  persistant nausea and vomiting    Complete by:  As directed    Call MD for:  severe uncontrolled pain    Complete by:  As directed    Call MD for:  temperature >100.4    Complete by:  As directed    Diet - low sodium heart healthy    Complete by:  As directed    Discharge instructions    Complete by:  As directed    1)Avoid ibuprofen/Advil/Aleve/Motrin/Goody Powders/Naproxen/BC powders as these will make you more likely to bleed and can cause stomach ulcers 2) take medications as prescribed 3) follow-up with PCP in 1 week for repeat complete blood count (CBC test) and reevaluation  of your pneumonia/ lungs   Increase activity slowly    Complete by:  As directed         Discharge Medications     Allergies as of 06/23/2016      Reactions   Penicillins Rash   Has patient had a PCN reaction causing immediate rash, facial/tongue/throat swelling, SOB or lightheadedness with hypotension: No Has patient had a PCN reaction causing severe rash involving mucus membranes or skin necrosis: No Has patient had a PCN reaction that required hospitalization No Has patient had a PCN reaction occurring within the last 10 years: No If all of the above answers are "NO", then may proceed with Cephalosporin use.      Medication List    TAKE these medications   amoxicillin-clavulanate 875-125 MG tablet Commonly known as:  AUGMENTIN Take 1 tablet by mouth 2 (two) times daily.   escitalopram 10 MG tablet Commonly known as:  LEXAPRO Take 10 mg by mouth daily.   furosemide 20 MG tablet Commonly known as:  LASIX Take 10 mg by mouth daily.   gabapentin 300 MG capsule Commonly known as:  NEURONTIN Take 300 mg by mouth 3 (three) times daily.   glipiZIDE-metformin 5-500 MG tablet Commonly known  as:  METAGLIP Take 1 tablet by mouth 2 (two) times daily before a meal.   guaiFENesin 600 MG 12 hr tablet Commonly known as:  MUCINEX Take 1 tablet (600 mg total) by mouth 2 (two) times daily.   lisinopril 20 MG tablet Commonly known as:  PRINIVIL,ZESTRIL Take 20 mg by mouth daily.   LORazepam 0.5 MG tablet Commonly known as:  ATIVAN Take 0.5 mg by mouth every 4 (four) hours as needed for anxiety.  meclizine 25 MG tablet Commonly known as:  ANTIVERT Take 25 mg by mouth 3 (three) times daily as needed for dizziness or nausea.   MYRBETRIQ 25 MG Tb24 tablet Generic drug:  mirabegron ER Take 25 mg by mouth daily.   nystatin-triamcinolone cream Commonly known as:  MYCOLOG II Apply topically 2 (two) times daily. To rash in groin/folds   pantoprazole 40 MG tablet Commonly known as:  PROTONIX Take 1 tablet (40 mg total) by mouth 2 (two) times daily before a meal.   polyethylene glycol packet Commonly known as:  MIRALAX / GLYCOLAX Take 17 g by mouth daily.   simvastatin 40 MG tablet Commonly known as:  ZOCOR Take 40 mg by mouth at bedtime.       Major procedures and Radiology Reports - PLEASE review detailed and final reports for all details, in brief -   Dg Chest 2 View  Result Date: 06/23/2016 CLINICAL DATA:  Shortness of Breath EXAM: CHEST  2 VIEW COMPARISON:  06/20/2016 FINDINGS: Cardiac shadow remains enlarged. Left lung infiltrate is again identified but improved when compared with the prior exam. Large hiatal hernia is noted. No other focal abnormality is seen. IMPRESSION: Stable hiatal hernia. Improving left lung infiltrate. Electronically Signed   By: Alcide Clever M.D.   On: 06/23/2016 13:58   Dg Abd Acute W/chest  Result Date: 06/20/2016 CLINICAL DATA:  Fevers EXAM: DG ABDOMEN ACUTE W/ 1V CHEST COMPARISON:  09/27/2013 FINDINGS: Cardiac shadow is enlarged. Diffuse left-sided infiltrate is noted occupying the mid and lower lung zone. The right lung is clear. No free  air is seen. Scattered large and small bowel gas is noted. No obstructive changes are noted. No abnormal calcifications are seen. A hiatal hernia is noted. No bony abnormality is noted. IMPRESSION: Significant left-sided infiltrate. No acute abdominal abnormality noted. Electronically Signed   By: Alcide Clever M.D.   On: 06/20/2016 21:02    Micro Results   Recent Results (from the past 240 hour(s))  Culture, blood (routine x 2)     Status: None (Preliminary result)   Collection Time: 06/20/16  8:13 PM  Result Value Ref Range Status   Specimen Description LEFT ANTECUBITAL  Final   Special Requests   Final    BOTTLES DRAWN AEROBIC AND ANAEROBIC Blood Culture adequate volume   Culture NO GROWTH 3 DAYS  Final   Report Status PENDING  Incomplete  Culture, blood (routine x 2)     Status: None (Preliminary result)   Collection Time: 06/20/16  8:13 PM  Result Value Ref Range Status   Specimen Description BLOOD LEFT HAND  Final   Special Requests   Final    BOTTLES DRAWN AEROBIC AND ANAEROBIC Blood Culture adequate volume   Culture NO GROWTH 3 DAYS  Final   Report Status PENDING  Incomplete       Today   Subjective    [name] today has no new c/o, No fever  Or chills , nausea, vomiting, diarrhea           Patient has been seen and examined prior to discharge   Objective   Blood pressure (!) 151/71, pulse 81, temperature 99.7 F (37.6 C), temperature source Oral, resp. rate 18, height 5\' 4"  (1.626 m), weight 80.3 kg (177 lb), SpO2 95 %.   Intake/Output Summary (Last 24 hours) at 06/23/16 1441 Last data filed at 06/23/16 1230  Gross per 24 hour  Intake          1853.75  ml  Output             1100 ml  Net           753.75 ml    Exam Gen:- Awake  In no apparent distress , speaking in complete sentences HEENT:- [place].AT,   Neck-Supple Neck,No JVD,  Lungs- overall improved aeration, no significant adventitious sounds CV- S1, S2 normal Abd-  +ve B.Sounds, Abd Soft, No  tenderness,    Extremity/Skin:- Intact peripheral pulses     Data Review   CBC w Diff: Lab Results  Component Value Date   WBC 10.3 06/23/2016   HGB 8.7 (L) 06/23/2016   HCT 28.2 (L) 06/23/2016   PLT 271 06/23/2016   LYMPHOPCT 9 06/20/2016   MONOPCT 4 06/20/2016   EOSPCT 1 06/20/2016   BASOPCT 0 06/20/2016    CMP: Lab Results  Component Value Date   NA 137 06/23/2016   K 4.0 06/23/2016   CL 102 06/23/2016   CO2 27 06/23/2016   BUN 19 06/23/2016   CREATININE 1.29 (H) 06/23/2016   PROT 7.1 06/18/2016   ALBUMIN 3.2 (L) 06/18/2016   BILITOT 0.2 (L) 06/18/2016   ALKPHOS 62 06/18/2016   AST 18 06/18/2016   ALT 10 (L) 06/18/2016  .   Total Discharge time is about 33 minutes  [name] M.D on 06/23/2016 at 2:41 PM  [facility]   Office  [phone]  Voice Recognition Reubin Milan dictation system was used to create this note, attempts have been made to correct errors. Please contact the author with questions and/or clarifications.

## 2016-06-23 NOTE — Discharge Instructions (Signed)
1)Avoid ibuprofen/Advil/Aleve/Motrin/Goody Powders/Naproxen/BC powders as these will make you more likely to bleed and can cause stomach ulcers 2) take medications as prescribed 3) follow-up with PCP in 1 week for repeat complete blood count (CBC test) and reevaluation  of your pneumonia/ lungs

## 2016-06-24 ENCOUNTER — Encounter (HOSPITAL_COMMUNITY): Payer: Self-pay | Admitting: Internal Medicine

## 2016-06-24 LAB — H. PYLORI ANTIBODY, IGG: H Pylori IgG: <0.8 Index Value (ref 0.00–0.79)

## 2016-06-25 LAB — CULTURE, BLOOD (ROUTINE X 2)
Culture: NO GROWTH
Culture: NO GROWTH
SPECIAL REQUESTS: ADEQUATE
SPECIAL REQUESTS: ADEQUATE

## 2016-08-25 ENCOUNTER — Encounter (INDEPENDENT_AMBULATORY_CARE_PROVIDER_SITE_OTHER): Payer: Self-pay | Admitting: *Deleted

## 2016-11-25 ENCOUNTER — Observation Stay (HOSPITAL_COMMUNITY)
Admission: EM | Admit: 2016-11-25 | Discharge: 2016-11-27 | Disposition: A | Discharge status: Home / Self Care | Payer: Medicare Other | Attending: Internal Medicine | Admitting: Internal Medicine

## 2016-11-25 ENCOUNTER — Encounter (HOSPITAL_COMMUNITY): Payer: Self-pay | Admitting: Emergency Medicine

## 2016-11-25 DIAGNOSIS — K625 Hemorrhage of anus and rectum: Principal | ICD-10-CM

## 2016-11-25 DIAGNOSIS — F418 Other specified anxiety disorders: Secondary | ICD-10-CM | POA: Diagnosis present

## 2016-11-25 DIAGNOSIS — Z79899 Other long term (current) drug therapy: Secondary | ICD-10-CM | POA: Insufficient documentation

## 2016-11-25 DIAGNOSIS — F17210 Nicotine dependence, cigarettes, uncomplicated: Secondary | ICD-10-CM | POA: Diagnosis not present

## 2016-11-25 DIAGNOSIS — E7800 Pure hypercholesterolemia, unspecified: Secondary | ICD-10-CM | POA: Diagnosis present

## 2016-11-25 DIAGNOSIS — N183 Chronic kidney disease, stage 3 (moderate): Secondary | ICD-10-CM | POA: Diagnosis not present

## 2016-11-25 DIAGNOSIS — Z7984 Long term (current) use of oral hypoglycemic drugs: Secondary | ICD-10-CM | POA: Insufficient documentation

## 2016-11-25 DIAGNOSIS — E0822 Diabetes mellitus due to underlying condition with diabetic chronic kidney disease: Secondary | ICD-10-CM | POA: Diagnosis not present

## 2016-11-25 DIAGNOSIS — I129 Hypertensive chronic kidney disease with stage 1 through stage 4 chronic kidney disease, or unspecified chronic kidney disease: Secondary | ICD-10-CM | POA: Diagnosis not present

## 2016-11-25 DIAGNOSIS — Z23 Encounter for immunization: Secondary | ICD-10-CM | POA: Insufficient documentation

## 2016-11-25 DIAGNOSIS — R1011 Right upper quadrant pain: Secondary | ICD-10-CM

## 2016-11-25 DIAGNOSIS — D649 Anemia, unspecified: Secondary | ICD-10-CM | POA: Diagnosis present

## 2016-11-25 DIAGNOSIS — E119 Type 2 diabetes mellitus without complications: Secondary | ICD-10-CM

## 2016-11-25 DIAGNOSIS — I10 Essential (primary) hypertension: Secondary | ICD-10-CM | POA: Diagnosis present

## 2016-11-25 HISTORY — DX: Other specified anxiety disorders: F41.8

## 2016-11-25 LAB — COMPREHENSIVE METABOLIC PANEL
ALK PHOS: 61 U/L (ref 38–126)
ALT: 12 U/L — ABNORMAL LOW (ref 14–54)
ANION GAP: 11 (ref 5–15)
AST: 25 U/L (ref 15–41)
Albumin: 3.6 g/dL (ref 3.5–5.0)
BUN: 34 mg/dL — ABNORMAL HIGH (ref 6–20)
CALCIUM: 8.7 mg/dL — AB (ref 8.9–10.3)
CO2: 25 mmol/L (ref 22–32)
Chloride: 101 mmol/L (ref 101–111)
Creatinine, Ser: 1.33 mg/dL — ABNORMAL HIGH (ref 0.44–1.00)
GFR calc non Af Amer: 39 mL/min — ABNORMAL LOW (ref >60)
GFR, EST AFRICAN AMERICAN: 45 mL/min — AB (ref >60)
Glucose, Bld: 122 mg/dL — ABNORMAL HIGH (ref 65–99)
Potassium: 5 mmol/L (ref 3.5–5.1)
SODIUM: 137 mmol/L (ref 135–145)
TOTAL PROTEIN: 7.5 g/dL (ref 6.5–8.1)
Total Bilirubin: 0.5 mg/dL (ref 0.3–1.2)

## 2016-11-25 LAB — URINALYSIS, ROUTINE W REFLEX MICROSCOPIC
BILIRUBIN URINE: NEGATIVE
Glucose, UA: NEGATIVE mg/dL
Ketones, ur: NEGATIVE mg/dL
NITRITE: NEGATIVE
PH: 5 (ref 5.0–8.0)
Protein, ur: NEGATIVE mg/dL
SPECIFIC GRAVITY, URINE: 1.008 (ref 1.005–1.030)

## 2016-11-25 LAB — PROTIME-INR
INR: 0.91
Prothrombin Time: 12.2 seconds (ref 11.4–15.2)

## 2016-11-25 LAB — TYPE AND SCREEN
ABO/RH(D): A POS
ANTIBODY SCREEN: NEGATIVE

## 2016-11-25 LAB — CBG MONITORING, ED: Glucose-Capillary: 235 mg/dL — ABNORMAL HIGH (ref 65–99)

## 2016-11-25 LAB — GLUCOSE, CAPILLARY: Glucose-Capillary: 116 mg/dL — ABNORMAL HIGH (ref 65–99)

## 2016-11-25 LAB — CBC
HCT: 31.5 % — ABNORMAL LOW (ref 36.0–46.0)
HEMOGLOBIN: 9.7 g/dL — AB (ref 12.0–15.0)
MCH: 25.5 pg — AB (ref 26.0–34.0)
MCHC: 30.8 g/dL (ref 30.0–36.0)
MCV: 82.9 fL (ref 78.0–100.0)
Platelets: 301 10*3/uL (ref 150–400)
RBC: 3.8 MIL/uL — ABNORMAL LOW (ref 3.87–5.11)
RDW: 13.7 % (ref 11.5–15.5)
WBC: 8 10*3/uL (ref 4.0–10.5)

## 2016-11-25 LAB — APTT

## 2016-11-25 LAB — LIPASE, BLOOD: Lipase: 45 U/L (ref 11–51)

## 2016-11-25 LAB — HEMATOCRIT: HEMATOCRIT: 27.1 % — AB (ref 36.0–46.0)

## 2016-11-25 LAB — HEMOGLOBIN: HEMOGLOBIN: 8.6 g/dL — AB (ref 12.0–15.0)

## 2016-11-25 MED ORDER — SODIUM CHLORIDE 0.9 % IV SOLN
INTRAVENOUS | Status: DC
  Administered 2016-11-25: 20:00:00 via INTRAVENOUS

## 2016-11-25 MED ORDER — ONDANSETRON HCL 4 MG PO TABS: 4.0000 mg | ORAL_TABLET | Freq: Four times a day (QID) | ORAL | Status: DC | PRN

## 2016-11-25 MED ORDER — ONDANSETRON HCL 4 MG/2ML IJ SOLN: 4.0000 mg | Freq: Four times a day (QID) | INTRAMUSCULAR | Status: DC | PRN

## 2016-11-25 MED ORDER — SODIUM CHLORIDE 0.9 % IV SOLN
INTRAVENOUS | Status: AC
  Administered 2016-11-25 – 2016-11-26 (×2): via INTRAVENOUS

## 2016-11-25 MED ORDER — INFLUENZA VAC SPLIT HIGH-DOSE 0.5 ML IM SUSY
0.5000 mL | PREFILLED_SYRINGE | INTRAMUSCULAR | Status: AC
  Administered 2016-11-26: 0.5 mL via INTRAMUSCULAR
  Filled 2016-11-25: qty 0.5

## 2016-11-25 MED ORDER — PANTOPRAZOLE SODIUM 40 MG IV SOLR
40.0000 mg | INTRAVENOUS | Status: DC
  Administered 2016-11-25: 40 mg via INTRAVENOUS
  Filled 2016-11-25: qty 40

## 2016-11-25 NOTE — ED Triage Notes (Signed)
Pt here with caregiver. Pt c/o r upper and lower abd pain x 2 days. caregive states saw moderate amount of bright red blood in toilet with bm today. Alert/oreinted to most.

## 2016-11-25 NOTE — H&P (Signed)
History and Physical    Nicole SmilesBrenda S [name] ZOX:[id number] DOB: [birth year] DOA: 11/25/2016  PCP: Nicole GrippeKim, James, MD   Patient coming from: Home.  I have personally briefly reviewed [facility]  Chief Complaint: Rectal bleeding.  HPI: [name] is a 76 y.o. female with medical history significant of depression with anxiety, type 2 diabetes, hyperlipidemia, hypertension who is coming to the emergency department with complaints of rectal bleeding associated with RUQ pain since yesterday. She had significant BRBPR this morning after a bowel movement. She was seen for syncope and symptomatic anemia in February 2018 and had an EGD showing erosive gastritis and GERD. She declined colonoscopy. In May of this year, she was sent by her PCP due to fatigue and a hemoglobin level of 6.9 g/dL. Repeat EGD showed Cameron's ulcer and large hiatal hernia. That time the patient had a colonoscopy which was significant for diverticulosis and external hemorrhoids. She is taking a regular aspirin every day. She also complains of on and off abdominal pain, which has been worse over the past 2 days. She denies using any other NSAIDs. She denies fever, chills, but feels fatigue. Denies headache, sore throat, dyspnea, chest pain, palpitations, dizziness, diaphoresis, PND, orthopnea, recent lower extremity edema, nausea, emesis, melena, dysuria, frequency or hematuria. No polyuria, polydipsia or blurred vision.  ED Course: Initial vital signs in the emergency department temperature 97.16F, pulse 94, respirations 16, blood pressure 144/81 96% on room air. her wbc was 8.0, hemoglobin 9.7 g/dl (last hemoglobin levels in may were ranging from 8.7-9.5 g/dl) and platelets were 811301. PT was 12.2, INR 0.92 and PTT was 20. Her CMP shows a sodium of 137, potassium 5.0, chloride 101 and bicarbonate 21 mmol/L. BUN was 34, creatinine 1.33 and glucose 122 mg/dL. Her LFTs were within normal limits. Lipase  was 45.  Review of Systems: As per HPI otherwise 10 point review of systems negative.    Past Medical History:  Diagnosis Date  . Depression with anxiety   . Diabetes mellitus without complication (HCC)   . Hypercholesterolemia   . Hypertension     Past Surgical History:  Procedure Laterality Date  . ABDOMINAL HYSTERECTOMY    . COLONOSCOPY WITH PROPOFOL N/A 06/20/2016   Procedure: COLONOSCOPY WITH PROPOFOL;  Surgeon: Malissa Hippoehman, Najeeb U, MD;  [facility]: [facility];  Service: Endoscopy;  Laterality: N/A;  . ESOPHAGOGASTRODUODENOSCOPY N/A 03/03/2016   Procedure: ESOPHAGOGASTRODUODENOSCOPY (EGD);  Surgeon: Malissa HippoNajeeb U Rehman, MD;  Location: [facility];  Service: Endoscopy;  Laterality: N/A;  . ESOPHAGOGASTRODUODENOSCOPY (EGD) WITH PROPOFOL N/A 06/20/2016   Procedure: ESOPHAGOGASTRODUODENOSCOPY (EGD) WITH PROPOFOL;  Surgeon: Malissa Hippoehman, Najeeb U, MD;  Location: [facility];  Service: Endoscopy;  Laterality: N/A;  . FRACTURE SURGERY       reports that she has been smoking Cigarettes.  She has been smoking about 0.00 packs per day. She has never used smokeless tobacco. She reports that she does not drink alcohol or use drugs.  Allergies  Allergen Reactions  . Penicillins Rash    Has patient had a PCN reaction causing immediate rash, facial/tongue/throat swelling, SOB or lightheadedness with hypotension: No Has patient had a PCN reaction causing severe rash involving mucus membranes or skin necrosis: No Has patient had a PCN reaction that required hospitalization No Has patient had a PCN reaction occurring within the last 10 years: No If all of the above answers are "NO", then may proceed with Cephalosporin use.      Family  History  Problem Relation Age of Onset  . Hypertension Other     Prior to Admission medications   Medication Sig Start Date End Date Taking? Authorizing Provider  escitalopram (LEXAPRO) 10 MG tablet Take 10 mg by mouth daily.   Yes [provider]    furosemide (LASIX) 20 MG tablet Take 10 mg by mouth daily.  05/02/16  Yes [provider]  glipiZIDE-metformin (METAGLIP) 5-500 MG per tablet Take 1 tablet by mouth 2 (two) times daily before a meal.   Yes [provider]  lisinopril (PRINIVIL,ZESTRIL) 20 MG tablet Take 20 mg by mouth daily.  06/02/16  Yes [provider]  LORazepam (ATIVAN) 0.5 MG tablet Take 0.5 mg by mouth 2 (two) times daily.    Yes [provider]  mirabegron ER (MYRBETRIQ) 25 MG TB24 tablet Take 25 mg by mouth daily.   Yes [provider]  nystatin-triamcinolone (MYCOLOG II) cream Apply topically 2 (two) times daily. To rash in groin/folds Patient taking differently: Apply 1 application topically daily as needed. To rash in groin/folds 06/23/16  Yes Emokpae, Courage, MD  pantoprazole (PROTONIX) 40 MG tablet Take 1 tablet (40 mg total) by mouth 2 (two) times daily before a meal. 06/23/16  Yes Emokpae, Courage, MD  simvastatin (ZOCOR) 40 MG tablet Take 40 mg by mouth at bedtime.    Yes [provider]    Physical Exam: Vitals:   11/25/16 1845 11/25/16 1900 11/25/16 1932 11/25/16 1932  BP:  (!) 158/77 (!) 158/77 (!) 142/84  Pulse: 82 83 81 77  Resp:   16   Temp:      TempSrc:      SpO2: 97% 97% 97% 100%  Weight:      Height:        Constitutional: NAD, calm, comfortable Eyes: PERRL, lids and conjunctivae are pale. ENMT: Severely impaired hearing. Mucous membranes are moist. Posterior pharynx clear of any exudate or lesions. Neck: normal, supple, no masses, no thyromegaly Respiratory: clear to auscultation bilaterally, no wheezing, no crackles. Normal respiratory effort. No accessory muscle use.  Cardiovascular: Regular rate and rhythm, no murmurs / rubs / gallops. No extremity edema. 2+ pedal pulses. No carotid bruits.  Abdomen and rectal: Bowel sounds positive. Soft, positive RUQ tenderness, no guarding/rebound/masses palpated. No hepatosplenomegaly. Rectal  examination done by Dr. Lynelle Doctor showed gross blood on exam, no melena and no masses. Musculoskeletal: no clubbing / cyanosis. Good ROM, no contractures. Normal muscle tone.  Skin: Small ecchymosis on extremities, otherwise no significant lesions seen on limited skin exam Neurologic: CN 2-12 grossly intact. Sensation intact, DTR normal. Strength 5/5 in all 4.  Psychiatric: Normal judgment and insight. Alert and oriented x 3. Normal mood.   Labs on Admission: I have personally reviewed following labs and imaging studies  CBC:  Recent Labs Lab 11/25/16 1547  WBC 8.0  HGB 9.7*  HCT 31.5*  MCV 82.9  PLT 301   Basic Metabolic Panel:  Recent Labs Lab 11/25/16 1641  NA 137  K 5.0  CL 101  CO2 25  GLUCOSE 122*  BUN 34*  CREATININE 1.33*  CALCIUM 8.7*   GFR: Estimated Creatinine Clearance: 37.9 mL/min (A) (by C-G formula based on SCr of 1.33 mg/dL (H)). Liver Function Tests:  Recent Labs Lab 11/25/16 1641  AST 25  ALT 12*  ALKPHOS 61  BILITOT 0.5  PROT 7.5  ALBUMIN 3.6    Recent Labs Lab 11/25/16 1641  LIPASE 45   No results for input(s):  AMMONIA in the last 168 hours. Coagulation Profile:  Recent Labs Lab 11/25/16 1554  INR 0.91   Cardiac Enzymes: No results for input(s): CKTOTAL, CKMB, CKMBINDEX, TROPONINI in the last 168 hours. BNP (last 3 results) No results for input(s): PROBNP in the last 8760 hours. HbA1C: No results for input(s): HGBA1C in the last 72 hours. CBG:  Recent Labs Lab 11/25/16 1413 11/25/16 2138  GLUCAP 235* 116*   Lipid Profile: No results for input(s): CHOL, HDL, LDLCALC, TRIG, CHOLHDL, LDLDIRECT in the last 72 hours. Thyroid Function Tests: No results for input(s): TSH, T4TOTAL, FREET4, T3FREE, THYROIDAB in the last 72 hours. Anemia Panel: No results for input(s): VITAMINB12, FOLATE, FERRITIN, TIBC, IRON, RETICCTPCT in the last 72 hours. Urine analysis:    Component Value Date/Time   COLORURINE STRAW (A) 11/25/2016 1730    APPEARANCEUR CLEAR 11/25/2016 1730   LABSPEC 1.008 11/25/2016 1730   PHURINE 5.0 11/25/2016 1730   GLUCOSEU NEGATIVE 11/25/2016 1730   HGBUR MODERATE (A) 11/25/2016 1730   BILIRUBINUR NEGATIVE 11/25/2016 1730   KETONESUR NEGATIVE 11/25/2016 1730   PROTEINUR NEGATIVE 11/25/2016 1730   UROBILINOGEN 0.2 09/27/2013 1300   NITRITE NEGATIVE 11/25/2016 1730   LEUKOCYTESUR MODERATE (A) 11/25/2016 1730    Radiological Exams on Admission: No results found.  EKG: Independently reviewed.   Assessment/Plan Principal Problem:   Rectal bleeding Observation/telemetry. Clear liquids diet tonight. Nothing by mouth after midnight. Continue IV fluids. Pantoprazole 40 mg IVP 1 dose. Continue pantoprazole 40 mg by mouth daily in the morning. Monitor hematocrit and hemoglobin. Transfuse if needed. Reconsult gastroenterology in the morning.  Active Problems:   Anemia Monitor hematocrit and hemoglobin. Check anemia panel in a.m.    HTN (hypertension) Hold furosemide and lisinopril to decrease incidence of dizziness/risk of fall. Resume if blood pressure increases and hemoglobin level has stabilized. Monitor blood pressure, renal function and electrolytes.    Type 2 diabetes mellitus (HCC) Last hemoglobin A1c was 7.1% nine months ago. Carbohydrate modified diet. Hold metoprolol while nothing by mouth. CBG monitoring before meals and at bedtime    Hypercholesterolemia Continue simvastatin 40 mg by mouth daily. LFTs are normal. Fasting lipid panel to be followed by primary as an outpatient.    Depression with anxiety. Continue Lexapro 10 mg by mouth daily. Continue Ativan 0.5 mg by mouth twice a day.     DVT prophylaxis: SCDs Code Status: Full code. Family Communication:  Disposition Plan: Admit for H&H monitoring and GI evaluation in the morning. Consults called: Gastroenterology routine morning consult. Admission status: Observation/telemetry.   Bobette Mo MD [facility] Pager [phone].  If 7PM-7AM, please contact night-coverage [email or web address] Password [email or web address]  11/25/2016, 10:06 PM

## 2016-11-25 NOTE — ED Provider Notes (Signed)
[facility] EMERGENCY DEPARTMENT Provider Note   CSN: [id number] Arrival date & time: 11/25/16  1404     History   Chief Complaint Chief Complaint  Patient presents with  . Abdominal Pain  . Rectal Bleeding    HPI [name] is a 76 y.o. female.  HPI Presents to the emergency room for evaluation of abdominal pain associated with rectal bleeding.  Patient states she has had some mild to moderate intermittent pain in her right upper and lower abdomen for the last couple of days.  Patient is currently not having any pain.  This morning when she went to have a bowel movement she noticed a moderate amount of blood.  She denies any urinary issues.  She has not had multiple episodes of rectal bleeding however she does have a history of prior GI bleeds requiring transfusion.  Patient denies any current chest pain or shortness of breath.  No lightheadedness. Past Medical History:  Diagnosis Date  . Diabetes mellitus without complication (HCC)   . Hypercholesterolemia   . Hypertension     Patient Active Problem List   Diagnosis Date Noted  . GI (gastrointestinal bleed) 06/18/2016  . Anemia due to acute blood loss 06/18/2016  . CKD (chronic kidney disease), stage III (HCC) 06/18/2016  . Hyponatremia 03/02/2016  . AKI (acute kidney injury) (HCC) 02/29/2016  . Uncontrolled type 2 diabetes mellitus with hyperglycemia, without long-term current use of insulin (HCC) 02/29/2016  . Influenza-like illness 02/29/2016  . Hypernatremia 02/29/2016  . Anemia   . Dehydration   . Hypotension   . Near syncope   . UTI (urinary tract infection) 09/27/2013  . HTN (hypertension) 09/27/2013  . Diabetes (HCC) 09/27/2013  . Dysphasia 09/27/2013  . Altered mental status 09/27/2013    Past Surgical History:  Procedure Laterality Date  . ABDOMINAL HYSTERECTOMY    . COLONOSCOPY WITH PROPOFOL N/A 06/20/2016   Procedure: COLONOSCOPY WITH PROPOFOL;  Surgeon: Malissa Hippoehman, Najeeb U, MD;  Location: [facility];  Service: Endoscopy;  Laterality: N/A;  . ESOPHAGOGASTRODUODENOSCOPY N/A 03/03/2016   Procedure: ESOPHAGOGASTRODUODENOSCOPY (EGD);  Surgeon: Malissa HippoNajeeb U Rehman, MD;  Location: [facility];  Service: Endoscopy;  Laterality: N/A;  . ESOPHAGOGASTRODUODENOSCOPY (EGD) WITH PROPOFOL N/A 06/20/2016   Procedure: ESOPHAGOGASTRODUODENOSCOPY (EGD) WITH PROPOFOL;  Surgeon: Malissa Hippoehman, Najeeb U, MD;  Location: [facility];  Service: Endoscopy;  Laterality: N/A;  . FRACTURE SURGERY      OB History    Gravida Para Term Preterm AB Living   0             SAB TAB Ectopic Multiple Live Births                   Home Medications    Prior to Admission medications   Medication Sig Start Date End Date Taking? Authorizing Provider  escitalopram (LEXAPRO) 10 MG tablet Take 10 mg by mouth daily.   Yes [provider]  furosemide (LASIX) 20 MG tablet Take 10 mg by mouth daily.  05/02/16  Yes [provider]  glipiZIDE-metformin (METAGLIP) 5-500 MG per tablet Take 1 tablet by mouth 2 (two) times daily before a meal.   Yes [provider]  lisinopril (PRINIVIL,ZESTRIL) 20 MG tablet Take 20 mg by mouth daily.  06/02/16  Yes [provider]  LORazepam (ATIVAN) 0.5 MG tablet Take 0.5 mg by mouth 2 (two) times daily.    Yes [provider]  mirabegron ER (MYRBETRIQ) 25 MG TB24 tablet Take 25  mg by mouth daily.   Yes [provider]  nystatin-triamcinolone (MYCOLOG II) cream Apply topically 2 (two) times daily. To rash in groin/folds Patient taking differently: Apply 1 application topically daily as needed. To rash in groin/folds 06/23/16  Yes Emokpae, Courage, MD  pantoprazole (PROTONIX) 40 MG tablet Take 1 tablet (40 mg total) by mouth 2 (two) times daily before a meal. 06/23/16  Yes Emokpae, Courage, MD  simvastatin (ZOCOR) 40 MG tablet Take 40 mg by mouth at bedtime.    Yes [provider]    Family History History reviewed. No pertinent family  history.  Social History Social History  Substance Use Topics  . Smoking status: Current Some Day Smoker    Packs/day: 0.00    Types: Cigarettes  . Smokeless tobacco: Never Used     Comment: 2 cigarettes daily   . Alcohol use No     Allergies   Penicillins   Review of Systems Review of Systems  All other systems reviewed and are negative.    Physical Exam Updated Vital Signs BP (!) 158/77   Pulse 83   Temp 97.9 F (36.6 C) (Oral)   Resp 16   Ht 1.626 m (5\' 4" )   Wt 77.1 kg (170 lb)   SpO2 97%   BMI 29.18 kg/m   Physical Exam  Constitutional: No distress.  HENT:  Head: Normocephalic and atraumatic.  Right Ear: External ear normal.  Left Ear: External ear normal.  Eyes: Conjunctivae are normal. Right eye exhibits no discharge. Left eye exhibits no discharge. No scleral icterus.  Neck: Neck supple. No tracheal deviation present.  Cardiovascular: Normal rate, regular rhythm and intact distal pulses.   Pulmonary/Chest: Effort normal and breath sounds normal. No stridor. No respiratory distress. She has no wheezes. She has no rales.  Abdominal: Soft. Bowel sounds are normal. She exhibits no distension. There is no tenderness. There is no rebound and no guarding.  Genitourinary: Rectal exam shows guaiac positive stool. Rectal exam shows no mass.  Genitourinary Comments: Gross blood on exam, no melena, no mass  Musculoskeletal: She exhibits no edema or tenderness.  Neurological: She is alert. She has normal strength. No cranial nerve deficit (no facial droop, extraocular movements intact, no slurred speech) or sensory deficit. She exhibits normal muscle tone. She displays no seizure activity. Coordination normal.  Skin: Skin is warm and dry. No rash noted. She is not diaphoretic.  Psychiatric: She has a normal mood and affect.  Nursing note and vitals reviewed.    ED Treatments / Results  Labs (all labs ordered are listed, but only abnormal results are  displayed) Labs Reviewed  COMPREHENSIVE METABOLIC PANEL - Abnormal; Notable for the following:       Result Value   Glucose, Bld 122 (*)    BUN 34 (*)    Creatinine, Ser 1.33 (*)    Calcium 8.7 (*)    ALT 12 (*)    GFR calc non Af Amer 39 (*)    GFR calc Af Amer 45 (*)    All other components within normal limits  CBC - Abnormal; Notable for the following:    RBC 3.80 (*)    Hemoglobin 9.7 (*)    HCT 31.5 (*)    MCH 25.5 (*)    All other components within normal limits  URINALYSIS, ROUTINE W REFLEX MICROSCOPIC - Abnormal; Notable for the following:    Color, Urine STRAW (*)    Hgb urine dipstick MODERATE (*)  Leukocytes, UA MODERATE (*)    Bacteria, UA RARE (*)    Squamous Epithelial / LPF 0-5 (*)    All other components within normal limits  APTT - Abnormal; Notable for the following:    aPTT <20 (*)    All other components within normal limits  CBG MONITORING, ED - Abnormal; Notable for the following:    Glucose-Capillary 235 (*)    All other components within normal limits  LIPASE, BLOOD  PROTIME-INR  POC OCCULT BLOOD, ED  TYPE AND SCREEN     Procedures Procedures (including critical care time)  Medications Ordered in ED Medications  0.9 %  sodium chloride infusion (not administered)     Initial Impression / Assessment and Plan / ED Course  I have reviewed the triage vital signs and the nursing notes.  Pertinent labs & imaging results that were available during my care of the patient were reviewed by me and considered in my medical decision making (see chart for details).   Patient has a history of chronic GI blood loss.  She has a history of hospital admission back in May requiring blood transfusions per family.  She had an endoscopy that showed an ulcer at that time, and colonoscopy that showed hemorrhoidds.  Patient presents today with acute GI bleeding.  Her hemoglobin is stable.  However, considering her age and comorbidities, I will consult with the  medical service for admission, monitoring, serial hgb.  Final Clinical Impressions(s) / ED Diagnoses   Final diagnoses:  Gastrointestinal hemorrhage associated with anorectal source    New Prescriptions New Prescriptions   No medications on file     Linwood Dibbles, MD 11/25/16 1918

## 2016-11-26 ENCOUNTER — Observation Stay (HOSPITAL_COMMUNITY): Payer: Medicare Other

## 2016-11-26 ENCOUNTER — Observation Stay (HOSPITAL_BASED_OUTPATIENT_CLINIC_OR_DEPARTMENT_OTHER): Payer: Medicare Other

## 2016-11-26 DIAGNOSIS — D649 Anemia, unspecified: Secondary | ICD-10-CM | POA: Diagnosis not present

## 2016-11-26 DIAGNOSIS — I361 Nonrheumatic tricuspid (valve) insufficiency: Secondary | ICD-10-CM

## 2016-11-26 DIAGNOSIS — K625 Hemorrhage of anus and rectum: Secondary | ICD-10-CM | POA: Diagnosis not present

## 2016-11-26 LAB — ECHOCARDIOGRAM COMPLETE
AOPV: 0.54 m/s
AV Area VTI index: 0.82 cm2/m2
AV Area mean vel: 1.72 cm2
AV Mean grad: 8 mmHg
AV Peak grad: 16 mmHg
AV peak Index: 0.78
AV pk vel: 203 cm/s
AVA: 1.61 cm2
AVAREAMEANVIN: 0.87 cm2/m2
AVAREAVTI: 1.54 cm2
AVCELMEANRAT: 0.6
CHL CUP AV VALUE AREA INDEX: 0.82
CHL CUP AV VEL: 1.61
CHL CUP TV REG PEAK VELOCITY: 320 cm/s
DOP CAL AO MEAN VELOCITY: 127 cm/s
E/e' ratio: 24.69
EWDT: 173 ms
FS: 33 % (ref 28–44)
HEIGHTINCHES: 64 in
IVS/LV PW RATIO, ED: 1.05
LA diam index: 1.98 cm/m2
LA vol A4C: 42.6 ml
LA vol index: 22 mL/m2
LA vol: 43.4 mL
LASIZE: 39 mm
LDCA: 2.84 cm2
LEFT ATRIUM END SYS DIAM: 39 mm
LV E/e' medial: 24.69
LV E/e'average: 24.69
LV SIMPSON'S DISK: 61
LV TDI E'LATERAL: 4.9
LV dias vol: 55 mL (ref 46–106)
LV e' LATERAL: 4.9 cm/s
LVDIAVOLIN: 28 mL/m2
LVOT SV: 67 mL
LVOT VTI: 23.7 cm
LVOT peak VTI: 0.57 cm
LVOT peak grad rest: 5 mmHg
LVOTD: 19 mm
LVOTPV: 110 cm/s
LVSYSVOL: 22 mL
LVSYSVOLIN: 11 mL/m2
Lateral S' vel: 17.9 cm/s
MV Dec: 173
MV pk A vel: 129 m/s
MVPG: 6 mmHg
MVPKEVEL: 121 m/s
PW: 13 mm — AB (ref 0.6–1.1)
RV TAPSE: 21.7 mm
RV sys press: 44 mmHg
Stroke v: 33 ml
TDI e' medial: 5.77
TRMAXVEL: 320 cm/s
VTI: 41.8 cm
Weight: 2948.8 oz

## 2016-11-26 LAB — CBC WITH DIFFERENTIAL/PLATELET
BASOS PCT: 1 %
Basophils Absolute: 0 10*3/uL (ref 0.0–0.1)
EOS ABS: 0.4 10*3/uL (ref 0.0–0.7)
EOS PCT: 6 %
HCT: 26.6 % — ABNORMAL LOW (ref 36.0–46.0)
Hemoglobin: 8.4 g/dL — ABNORMAL LOW (ref 12.0–15.0)
LYMPHS ABS: 2.2 10*3/uL (ref 0.7–4.0)
Lymphocytes Relative: 37 %
MCH: 25.9 pg — AB (ref 26.0–34.0)
MCHC: 31.6 g/dL (ref 30.0–36.0)
MCV: 82.1 fL (ref 78.0–100.0)
Monocytes Absolute: 0.6 10*3/uL (ref 0.1–1.0)
Monocytes Relative: 10 %
NEUTROS PCT: 46 %
Neutro Abs: 2.8 10*3/uL (ref 1.7–7.7)
PLATELETS: 278 10*3/uL (ref 150–400)
RBC: 3.24 MIL/uL — AB (ref 3.87–5.11)
RDW: 13.5 % (ref 11.5–15.5)
WBC: 6.1 10*3/uL (ref 4.0–10.5)

## 2016-11-26 LAB — COMPREHENSIVE METABOLIC PANEL
ALBUMIN: 3.3 g/dL — AB (ref 3.5–5.0)
ALT: 13 U/L — AB (ref 14–54)
ANION GAP: 8 (ref 5–15)
AST: 21 U/L (ref 15–41)
Alkaline Phosphatase: 56 U/L (ref 38–126)
BUN: 25 mg/dL — ABNORMAL HIGH (ref 6–20)
CHLORIDE: 107 mmol/L (ref 101–111)
CO2: 26 mmol/L (ref 22–32)
Calcium: 8.3 mg/dL — ABNORMAL LOW (ref 8.9–10.3)
Creatinine, Ser: 1.15 mg/dL — ABNORMAL HIGH (ref 0.44–1.00)
GFR calc non Af Amer: 46 mL/min — ABNORMAL LOW (ref >60)
GFR, EST AFRICAN AMERICAN: 53 mL/min — AB (ref >60)
GLUCOSE: 141 mg/dL — AB (ref 65–99)
Potassium: 4.1 mmol/L (ref 3.5–5.1)
SODIUM: 141 mmol/L (ref 135–145)
Total Bilirubin: 0.3 mg/dL (ref 0.3–1.2)
Total Protein: 6.9 g/dL (ref 6.5–8.1)

## 2016-11-26 LAB — GLUCOSE, CAPILLARY
GLUCOSE-CAPILLARY: 135 mg/dL — AB (ref 65–99)
GLUCOSE-CAPILLARY: 161 mg/dL — AB (ref 65–99)
GLUCOSE-CAPILLARY: 121 mg/dL — AB (ref 65–99)
Glucose-Capillary: 134 mg/dL — ABNORMAL HIGH (ref 65–99)
Glucose-Capillary: 137 mg/dL — ABNORMAL HIGH (ref 65–99)

## 2016-11-26 LAB — RETICULOCYTES
RBC.: 3.24 MIL/uL — ABNORMAL LOW (ref 3.87–5.11)
Retic Count, Absolute: 29.2 10*3/uL (ref 19.0–186.0)
Retic Ct Pct: 0.9 % (ref 0.4–3.1)

## 2016-11-26 LAB — HEMOGLOBIN A1C
HEMOGLOBIN A1C: 7.4 % — AB (ref 4.8–5.6)
Mean Plasma Glucose: 165.68 mg/dL

## 2016-11-26 LAB — IRON AND TIBC
IRON: 23 ug/dL — AB (ref 28–170)
Saturation Ratios: 6 % — ABNORMAL LOW (ref 10.4–31.8)
TIBC: 391 ug/dL (ref 250–450)
UIBC: 368 ug/dL

## 2016-11-26 LAB — FERRITIN: FERRITIN: 8 ng/mL — AB (ref 11–307)

## 2016-11-26 LAB — FOLATE: FOLATE: 14.1 ng/mL (ref >5.9)

## 2016-11-26 LAB — OCCULT BLOOD, POC DEVICE: FECAL OCCULT BLD: POSITIVE — AB

## 2016-11-26 LAB — VITAMIN B12: Vitamin B-12: 199 pg/mL (ref 180–914)

## 2016-11-26 MED ORDER — LORAZEPAM 0.5 MG PO TABS
0.5000 mg | ORAL_TABLET | Freq: Two times a day (BID) | ORAL | Status: DC
  Administered 2016-11-26 – 2016-11-27 (×3): 0.5 mg via ORAL
  Filled 2016-11-26 (×4): qty 1

## 2016-11-26 MED ORDER — SIMVASTATIN 20 MG PO TABS
40.0000 mg | ORAL_TABLET | Freq: Every day | ORAL | Status: DC
  Administered 2016-11-26: 40 mg via ORAL
  Filled 2016-11-26: qty 2

## 2016-11-26 MED ORDER — NYSTATIN-TRIAMCINOLONE 100000-0.1 UNIT/GM-% EX CREA: 1.0000 "application " | TOPICAL_CREAM | Freq: Every day | CUTANEOUS | Status: DC | PRN

## 2016-11-26 MED ORDER — MIRABEGRON ER 25 MG PO TB24
25.0000 mg | ORAL_TABLET | Freq: Every day | ORAL | Status: DC
  Administered 2016-11-26 – 2016-11-27 (×2): 25 mg via ORAL
  Filled 2016-11-26 (×3): qty 1

## 2016-11-26 MED ORDER — PANTOPRAZOLE SODIUM 40 MG PO TBEC
40.0000 mg | DELAYED_RELEASE_TABLET | Freq: Two times a day (BID) | ORAL | Status: DC
  Administered 2016-11-26 – 2016-11-27 (×3): 40 mg via ORAL
  Filled 2016-11-26 (×4): qty 1

## 2016-11-26 MED ORDER — ESCITALOPRAM OXALATE 10 MG PO TABS
10.0000 mg | ORAL_TABLET | Freq: Every day | ORAL | Status: DC
  Administered 2016-11-26 – 2016-11-27 (×2): 10 mg via ORAL
  Filled 2016-11-26 (×3): qty 1

## 2016-11-26 NOTE — Consult Note (Signed)
Reason for Consult: GI bleed  Referring Physician:   [name] is an 76 y.o. female.  HPI: Admitted thru the ED yesterday.  She c/o rt  Upper sided abdominal pain yesterday. She also states she had some rectal bleeding. She says she had a BM and looked on the floor land she noticed bright red blood. Stools are brown in color.  She thinks she has been taking NSAIDs for pain.  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 Has required blood transfusion in pasta for GI bleed.  Colonoscopy revealed diverticulosis in the sigmoid colon. External hemorrhoids. EGD revealed non bleeding gastric ulcer with no stigmata of bleeding.  EGD in February of this year revealed:      A healed ulcer was found on the lesser curvature of the gastric antrum.      A few erosions were found in the gastric antrum and in the prepyloric       region of the stomach. Biopsies were taken with a cold forceps for       histology. She denies taking ASA. Rectal exam in the ED revealed gross blood on exam. No melena or masses. No BM since admission.  Admission Hemoglobin 9.7. This am her hemoglobin is 8.4.  This am she denies any abdominal pain.   CBC Latest Ref Rng & Units 11/26/2016 11/25/2016 11/25/2016  WBC 4.0 - 10.5 K/uL 6.1 - 8.0  Hemoglobin 12.0 - 15.0 g/dL 8.4(L) 8.6(L) 9.7(L)  Hematocrit 36.0 - 46.0 % 26.6(L) 27.1(L) 31.5(L)  Platelets 150 - 400 K/uL 278 - 301       Past Medical History:  Diagnosis Date  . Depression with anxiety   . Diabetes mellitus without complication ([place])   . Hypercholesterolemia   . Hypertension     Past Surgical History:  Procedure Laterality Date  . ABDOMINAL HYSTERECTOMY    . COLONOSCOPY WITH PROPOFOL N/A 06/20/2016   Procedure: COLONOSCOPY WITH PROPOFOL;  Surgeon: Rogene Houston, MD;  [facility];  Service: Endoscopy;  Laterality: N/A;  . ESOPHAGOGASTRODUODENOSCOPY N/A 03/03/2016   Procedure: ESOPHAGOGASTRODUODENOSCOPY  (EGD);  Surgeon: Rogene Houston, MD;  Location: [facility];  Service: Endoscopy;  Laterality: N/A;  . ESOPHAGOGASTRODUODENOSCOPY (EGD) WITH PROPOFOL N/A 06/20/2016   Procedure: ESOPHAGOGASTRODUODENOSCOPY (EGD) WITH PROPOFOL;  Surgeon: Rogene Houston, MD;  Location: [facility];  Service: Endoscopy;  Laterality: N/A;  . FRACTURE SURGERY      Family History  Problem Relation Age of Onset  . Hypertension Other     Social History:  reports that she has been smoking Cigarettes.  She has been smoking about 0.00 packs per day. She has never used smokeless tobacco. She reports that she does not drink alcohol or use drugs.  Allergies:  Allergies  Allergen Reactions  . Penicillins Rash    Has patient had a PCN reaction causing immediate rash, facial/tongue/throat swelling, SOB or lightheadedness with hypotension: No Has patient had a PCN reaction causing severe rash involving mucus membranes or skin necrosis: No Has patient had a PCN reaction that required hospitalization No Has patient had a PCN reaction occurring within the last 10 years: No If all of the above answers are "NO", then may proceed with Cephalosporin use.      Medications: I have reviewed the patient's current medications.  Results for orders placed or performed during the hospital encounter of 11/25/16 (from the past 48 hour(s))  CBG monitoring, ED  Status: Abnormal   Collection Time: 11/25/16  2:13 PM  Result Value Ref Range   Glucose-Capillary 235 (H) 65 - 99 mg/dL  CBC     Status: Abnormal   Collection Time: 11/25/16  3:47 PM  Result Value Ref Range   WBC 8.0 4.0 - 10.5 K/uL   RBC 3.80 (L) 3.87 - 5.11 MIL/uL   Hemoglobin 9.7 (L) 12.0 - 15.0 g/dL   HCT 31.5 (L) 36.0 - 46.0 %   MCV 82.9 78.0 - 100.0 fL   MCH 25.5 (L) 26.0 - 34.0 pg   MCHC 30.8 30.0 - 36.0 g/dL   RDW 13.7 11.5 - 15.5 %   Platelets 301 150 - 400 K/uL  Type and screen [facility]     Status: None   Collection Time: 11/25/16  3:47  PM  Result Value Ref Range   ABO/RH(D) A POS    Antibody Screen NEG    Sample Expiration 11/28/2016   APTT     Status: Abnormal   Collection Time: 11/25/16  3:54 PM  Result Value Ref Range   aPTT <20 (L) 24 - 36 seconds  Protime-INR     Status: None   Collection Time: 11/25/16  3:54 PM  Result Value Ref Range   Prothrombin Time 12.2 11.4 - 15.2 seconds   INR 0.91   Comprehensive metabolic panel     Status: Abnormal   Collection Time: 11/25/16  4:41 PM  Result Value Ref Range   Sodium 137 135 - 145 mmol/L   Potassium 5.0 3.5 - 5.1 mmol/L   Chloride 101 101 - 111 mmol/L   CO2 25 22 - 32 mmol/L   Glucose, Bld 122 (H) 65 - 99 mg/dL   BUN 34 (H) 6 - 20 mg/dL   Creatinine, Ser 1.33 (H) 0.44 - 1.00 mg/dL   Calcium 8.7 (L) 8.9 - 10.3 mg/dL   Total Protein 7.5 6.5 - 8.1 g/dL   Albumin 3.6 3.5 - 5.0 g/dL   AST 25 15 - 41 U/L   ALT 12 (L) 14 - 54 U/L   Alkaline Phosphatase 61 38 - 126 U/L   Total Bilirubin 0.5 0.3 - 1.2 mg/dL   GFR calc non Af Amer 39 (L) >60 mL/min   GFR calc Af Amer 45 (L) >60 mL/min    Comment: (NOTE) The eGFR has been calculated using the CKD EPI equation. This calculation has not been validated in all clinical situations. eGFR's persistently <60 mL/min signify possible Chronic Kidney Disease.    Anion gap 11 5 - 15  Lipase, blood     Status: None   Collection Time: 11/25/16  4:41 PM  Result Value Ref Range   Lipase 45 11 - 51 U/L  Urinalysis, Routine [name] reflex microscopic     Status: Abnormal   Collection Time: 11/25/16  5:30 PM  Result Value Ref Range   Color, Urine STRAW (A) YELLOW   APPearance CLEAR CLEAR   Specific Gravity, Urine 1.008 1.005 - 1.030   pH 5.0 5.0 - 8.0   Glucose, UA NEGATIVE NEGATIVE mg/dL   Hgb urine dipstick MODERATE (A) NEGATIVE   Bilirubin Urine NEGATIVE NEGATIVE   Ketones, ur NEGATIVE NEGATIVE mg/dL   Protein, ur NEGATIVE NEGATIVE mg/dL   Nitrite NEGATIVE NEGATIVE   Leukocytes, UA MODERATE (A) NEGATIVE   RBC / HPF 0-5 0 - 5  RBC/hpf   WBC, UA 6-30 0 - 5 WBC/hpf   Bacteria, UA RARE (A) NONE SEEN  Squamous Epithelial / LPF 0-5 (A) NONE SEEN  Glucose, capillary     Status: Abnormal   Collection Time: 11/25/16  9:38 PM  Result Value Ref Range   Glucose-Capillary 116 (H) 65 - 99 mg/dL   Comment 1 Notify RN    Comment 2 Document in Chart   Hemoglobin     Status: Abnormal   Collection Time: 11/25/16 10:41 PM  Result Value Ref Range   Hemoglobin 8.6 (L) 12.0 - 15.0 g/dL  Hematocrit     Status: Abnormal   Collection Time: 11/25/16 10:41 PM  Result Value Ref Range   HCT 27.1 (L) 36.0 - 46.0 %  CBC WITH DIFFERENTIAL     Status: Abnormal   Collection Time: 11/26/16  4:24 AM  Result Value Ref Range   WBC 6.1 4.0 - 10.5 K/uL   RBC 3.24 (L) 3.87 - 5.11 MIL/uL   Hemoglobin 8.4 (L) 12.0 - 15.0 g/dL   HCT 26.6 (L) 36.0 - 46.0 %   MCV 82.1 78.0 - 100.0 fL   MCH 25.9 (L) 26.0 - 34.0 pg   MCHC 31.6 30.0 - 36.0 g/dL   RDW 13.5 11.5 - 15.5 %   Platelets 278 150 - 400 K/uL   Neutrophils Relative % 46 %   Neutro Abs 2.8 1.7 - 7.7 K/uL   Lymphocytes Relative 37 %   Lymphs Abs 2.2 0.7 - 4.0 K/uL   Monocytes Relative 10 %   Monocytes Absolute 0.6 0.1 - 1.0 K/uL   Eosinophils Relative 6 %   Eosinophils Absolute 0.4 0.0 - 0.7 K/uL   Basophils Relative 1 %   Basophils Absolute 0.0 0.0 - 0.1 K/uL  Comprehensive metabolic panel     Status: Abnormal   Collection Time: 11/26/16  4:24 AM  Result Value Ref Range   Sodium 141 135 - 145 mmol/L   Potassium 4.1 3.5 - 5.1 mmol/L    Comment: DELTA CHECK NOTED   Chloride 107 101 - 111 mmol/L   CO2 26 22 - 32 mmol/L   Glucose, Bld 141 (H) 65 - 99 mg/dL   BUN 25 (H) 6 - 20 mg/dL   Creatinine, Ser 1.15 (H) 0.44 - 1.00 mg/dL   Calcium 8.3 (L) 8.9 - 10.3 mg/dL   Total Protein 6.9 6.5 - 8.1 g/dL   Albumin 3.3 (L) 3.5 - 5.0 g/dL   AST 21 15 - 41 U/L   ALT 13 (L) 14 - 54 U/L   Alkaline Phosphatase 56 38 - 126 U/L   Total Bilirubin 0.3 0.3 - 1.2 mg/dL   GFR calc non Af Amer 46 (L)  >60 mL/min   GFR calc Af Amer 53 (L) >60 mL/min    Comment: (NOTE) The eGFR has been calculated using the CKD EPI equation. This calculation has not been validated in all clinical situations. eGFR's persistently <60 mL/min signify possible Chronic Kidney Disease.    Anion gap 8 5 - 15  Reticulocytes     Status: Abnormal   Collection Time: 11/26/16  4:24 AM  Result Value Ref Range   Retic Ct Pct 0.9 0.4 - 3.1 %   RBC. 3.24 (L) 3.87 - 5.11 MIL/uL   Retic Count, Absolute 29.2 19.0 - 186.0 K/uL    No results found.  ROS Blood pressure 134/62, pulse 83, temperature (!) 97.5 F (36.4 C), temperature source Oral, resp. rate 20, height 5' 4"  (1.626 m), weight 184 lb 4.8 oz (83.6 kg), SpO2 98 %. Physical Exam Alert and oriented.  Skin warm and dry. Oral mucosa is moist.   . Sclera anicteric, conjunctivae is pink. Thyroid not enlarged. No cervical lymphadenopathy. Lungs clear. Heart regular rate and rhythm.  Abdomen is soft. Bowel sounds are positive. No hepatomegaly. No abdominal masses felt. No tenderness.  No edema to lower extremities.      Assessment/Plan: GI bleed (BRRB). Recent EGD and colonoscopy in May. Will discuss with Dr. Laural Golden.   [name] 11/26/2016, 7:56 AM

## 2016-11-26 NOTE — Progress Notes (Signed)
PROGRESS NOTE    [name]  ZOX:[id number] DOB: 1940/07/09 DOA: 11/25/2016 PCP: Pearson Grippe, MD     Brief Narrative:  This is a 76 year old woman admitted from home on 10/30 due to bright red blood per rectum and abdominal pain.  She had an EGD and colonoscopy in May 2018 for GI bleeding and was found to have diverticulosis of the sigmoid colon with external hemorrhoids and a nonbleeding gastric ulcer without stigmata of bleeding.   Assessment & Plan:   Principal Problem:   Rectal bleeding Active Problems:   HTN (hypertension)   Type 2 diabetes mellitus (HCC)   Anemia   Hypercholesterolemia   Depression with anxiety   Rectal bleeding -She has had no further bleeding since admission. -Awaiting GI recommendations, however I believe that given her recent studies and may and no further bleeding that treatment plan will likely be observation for now. -Continue clears until seen by GI, if no plan for studies can advance diet.  Acute blood loss anemia -Hemoglobin is above 8, no plans for transfusion at present.  Type 2 diabetes -Fair control, continue to monitor.  Hyperlipidemia -Continue simvastatin.  Depression with anxiety -Continue Lexapro, Ativan.   DVT prophylaxis: SCDs Code Status: Full code Family Communication: Patient only Disposition Plan: Likely discharge home in 24-48 hours pending GI recommendations  Consultants:   GI  Procedures:   None  Antimicrobials:  Anti-infectives    None       Subjective: Has no complaints other than being hungry has had no further rectal bleeding since admission.  Objective: Vitals:   11/25/16 1932 11/25/16 1932 11/25/16 2100 11/26/16 0507  BP: (!) 158/77 (!) 142/84 134/74 134/62  Pulse: 81 77 80 83  Resp: 16  20 20   Temp:   98.5 F (36.9 C) (!) 97.5 F (36.4 C)  TempSrc:   Oral Oral  SpO2: 97% 100% 96% 98%  Weight:   83.6 kg (184 lb 4.8 oz)   Height:   5\' 4"  (1.626 m)     Intake/Output Summary  (Last 24 hours) at 11/26/16 1729 Last data filed at 11/26/16 1718  Gross per 24 hour  Intake           464.93 ml  Output             2400 ml  Net         -1935.07 ml   Filed Weights   11/25/16 1408 11/25/16 2100  Weight: 77.1 kg (170 lb) 83.6 kg (184 lb 4.8 oz)    Examination:  General exam: Alert, awake, oriented x 3 Respiratory system: Clear to auscultation. Respiratory effort normal. Cardiovascular system:RRR. No murmurs, rubs, gallops. Gastrointestinal system: Abdomen is nondistended, soft and nontender. No organomegaly or masses felt. Normal bowel sounds heard. Central nervous system: Alert and oriented. No focal neurological deficits. Extremities: No C/C/E, +pedal pulses Skin: No rashes, lesions or ulcers Psychiatry: Judgement and insight appear normal. Mood & affect appropriate.     Data Reviewed: I have personally reviewed following labs and imaging studies  CBC:  Recent Labs Lab 11/25/16 1547 11/25/16 2241 11/26/16 0424  WBC 8.0  --  6.1  NEUTROABS  --   --  2.8  HGB 9.7* 8.6* 8.4*  HCT 31.5* 27.1* 26.6*  MCV 82.9  --  82.1  PLT 301  --  278   Basic Metabolic Panel:  Recent Labs Lab 11/25/16 1641 11/26/16 0424  NA 137 141  K 5.0 4.1  CL 101 107  CO2 25 26  GLUCOSE 122* 141*  BUN 34* 25*  CREATININE 1.33* 1.15*  CALCIUM 8.7* 8.3*   GFR: Estimated Creatinine Clearance: 45.6 mL/min (A) (by C-G formula based on SCr of 1.15 mg/dL (H)). Liver Function Tests:  Recent Labs Lab 11/25/16 1641 11/26/16 0424  AST 25 21  ALT 12* 13*  ALKPHOS 61 56  BILITOT 0.5 0.3  PROT 7.5 6.9  ALBUMIN 3.6 3.3*    Recent Labs Lab 11/25/16 1641  LIPASE 45   No results for input(s): AMMONIA in the last 168 hours. Coagulation Profile:  Recent Labs Lab 11/25/16 1554  INR 0.91   Cardiac Enzymes: No results for input(s): CKTOTAL, CKMB, CKMBINDEX, TROPONINI in the last 168 hours. BNP (last 3 results) No results for input(s): PROBNP in the last 8760  hours. HbA1C:  Recent Labs  11/26/16 0424  HGBA1C 7.4*   CBG:  Recent Labs Lab 11/25/16 1413 11/25/16 2138 11/26/16 0754 11/26/16 1142 11/26/16 1717  GLUCAP 235* 116* 134* 135* 161*   Lipid Profile: No results for input(s): CHOL, HDL, LDLCALC, TRIG, CHOLHDL, LDLDIRECT in the last 72 hours. Thyroid Function Tests: No results for input(s): TSH, T4TOTAL, FREET4, T3FREE, THYROIDAB in the last 72 hours. Anemia Panel:  Recent Labs  11/26/16 0424  VITAMINB12 199  FOLATE 14.1  FERRITIN 8*  TIBC 391  IRON 23*  RETICCTPCT 0.9   Urine analysis:    Component Value Date/Time   COLORURINE STRAW (A) 11/25/2016 1730   APPEARANCEUR CLEAR 11/25/2016 1730   LABSPEC 1.008 11/25/2016 1730   PHURINE 5.0 11/25/2016 1730   GLUCOSEU NEGATIVE 11/25/2016 1730   HGBUR MODERATE (A) 11/25/2016 1730   BILIRUBINUR NEGATIVE 11/25/2016 1730   KETONESUR NEGATIVE 11/25/2016 1730   PROTEINUR NEGATIVE 11/25/2016 1730   UROBILINOGEN 0.2 09/27/2013 1300   NITRITE NEGATIVE 11/25/2016 1730   LEUKOCYTESUR MODERATE (A) 11/25/2016 1730   Sepsis Labs: @LABRCNTIP (procalcitonin:4,lacticidven:4)  )No results found for this or any previous visit (from the past 240 hour(s)).       Radiology Studies: [place] Abdomen Limited Ruq  Result Date: 11/26/2016 CLINICAL DATA:  Right upper quadrant pain EXAM: ULTRASOUND ABDOMEN LIMITED RIGHT UPPER QUADRANT COMPARISON:  CT abdomen and pelvis March 02, 2016 FINDINGS: Gallbladder: Surgically absent. Common bile duct: Diameter: 5 mm. No intrahepatic or extrahepatic biliary duct dilatation. Liver: No focal lesion identified. Within normal limits in parenchymal echogenicity. Portal vein is patent on color Doppler imaging with normal direction of blood flow towards the liver. IMPRESSION: Gallbladder absent.  Study otherwise unremarkable. Electronically Signed   By: Bretta BangWilliam  Woodruff III M.D.   On: 11/26/2016 09:21        Scheduled Meds: . escitalopram  10 mg Oral  Daily  . LORazepam  0.5 mg Oral BID  . mirabegron ER  25 mg Oral Daily  . pantoprazole  40 mg Oral BID AC  . simvastatin  40 mg Oral QHS   Continuous Infusions: . sodium chloride 88 mL/hr at 11/26/16 1150     LOS: 0 days    Time spent: 35 minutes. Greater than 50% of this time was spent in direct contact with the patient coordinating care.     Chaya JanHERNANDEZ ACOSTA,ESTELA, MD [facility] Pager [phone]  If 7PM-7AM, please contact night-coverage [email or web address] Password [email or web address] 11/26/2016, 5:29 PM

## 2016-11-26 NOTE — Care Management (Signed)
Pt seen for delivery of MOON. Pt from home, lives with a friends mom. She has PCP, transportation to appointments and insurance with drug coverage. Pt's friend (Birda) at the bedside. Steward DroneBrenda is not POA but helps manage her medical and financial affairs. CM encouraged pt to complete advanced directive.

## 2016-11-26 NOTE — Progress Notes (Signed)
*  PRELIMINARY RESULTS* Echocardiogram 2D Echocardiogram has been performed.  Stacey DrainWhite, Shyheim Tanney J 11/26/2016, 11:30 AM

## 2016-11-26 NOTE — Care Management Important Message (Signed)
Important Message  Patient Details  Name: [name] MRN: [id number] Date of Birth: 02/20/1940   Medicare Important Message Given:  Yes    Malcolm MetroChildress, Rehan Holness Demske, RN 11/26/2016, 2:02 PM

## 2016-11-27 DIAGNOSIS — K625 Hemorrhage of anus and rectum: Secondary | ICD-10-CM | POA: Diagnosis not present

## 2016-11-27 LAB — BASIC METABOLIC PANEL
ANION GAP: 7 (ref 5–15)
BUN: 13 mg/dL (ref 6–20)
CALCIUM: 8.8 mg/dL — AB (ref 8.9–10.3)
CHLORIDE: 105 mmol/L (ref 101–111)
CO2: 27 mmol/L (ref 22–32)
Creatinine, Ser: 1.02 mg/dL — ABNORMAL HIGH (ref 0.44–1.00)
GFR calc Af Amer: >60 mL/min (ref >60)
GFR calc non Af Amer: 53 mL/min — ABNORMAL LOW (ref >60)
GLUCOSE: 133 mg/dL — AB (ref 65–99)
Potassium: 3.9 mmol/L (ref 3.5–5.1)
Sodium: 139 mmol/L (ref 135–145)

## 2016-11-27 LAB — CBC
HCT: 28.7 % — ABNORMAL LOW (ref 36.0–46.0)
HEMOGLOBIN: 8.8 g/dL — AB (ref 12.0–15.0)
MCH: 25.6 pg — AB (ref 26.0–34.0)
MCHC: 30.7 g/dL (ref 30.0–36.0)
MCV: 83.4 fL (ref 78.0–100.0)
Platelets: 249 10*3/uL (ref 150–400)
RBC: 3.44 MIL/uL — ABNORMAL LOW (ref 3.87–5.11)
RDW: 13.6 % (ref 11.5–15.5)
WBC: 5.7 10*3/uL (ref 4.0–10.5)

## 2016-11-27 LAB — GLUCOSE, CAPILLARY
Glucose-Capillary: 153 mg/dL — ABNORMAL HIGH (ref 65–99)
Glucose-Capillary: 272 mg/dL — ABNORMAL HIGH (ref 65–99)

## 2016-11-27 NOTE — Discharge Summary (Signed)
Physician Discharge Summary  [name] [id number] DOB: August 26, 1940 DOA: 11/25/2016  PCP: Pearson GrippeKim, James, MD  Admit date: 11/25/2016 Discharge date: 11/27/2016  Time spent: 45 minutes  Recommendations for Outpatient Follow-up:  -Will be discharged home today, advised follow-up with PCP in 2 weeks.  Discharge Diagnoses:  Principal Problem:   Rectal bleeding Active Problems:   HTN (hypertension)   Type 2 diabetes mellitus (HCC)   Anemia   Hypercholesterolemia   Depression with anxiety   Discharge Condition: Stable and improved  Filed Weights   11/25/16 1408 11/25/16 2100  Weight: 77.1 kg (170 lb) 83.6 kg (184 lb 4.8 oz)    History of present illness:  As per Dr. Robb [name] on 10/30: [name] is a 76 y.o. female with medical history significant of depression with anxiety, type 2 diabetes, hyperlipidemia, hypertension who is coming to the emergency department with complaints of rectal bleeding associated with RUQ pain since yesterday. She had significant BRBPR this morning after a bowel movement. She was seen for syncope and symptomatic anemia in February 2018 and had an EGD showing erosive gastritis and GERD. She declined colonoscopy. In May of this year, she was sent by her PCP due to fatigue and a hemoglobin level of 6.9 g/dL. Repeat EGD showed Cameron's ulcer and large hiatal hernia. That time the patient had a colonoscopy which was significant for diverticulosis and external hemorrhoids. She is taking a regular aspirin every day. She also complains of on and off abdominal pain, which has been worse over the past 2 days. She denies using any other NSAIDs. She denies fever, chills, but feels fatigue. Denies headache, sore throat, dyspnea, chest pain, palpitations, dizziness, diaphoresis, PND, orthopnea, recent lower extremity edema, nausea, emesis, melena, dysuria, frequency or hematuria. No polyuria, polydipsia or blurred vision.  ED Course: Initial vital signs in the  emergency department temperature 97.68F, pulse 94, respirations 16, blood pressure 144/81 96% on room air. her wbc was 8.0, hemoglobin 9.7 g/dl (last hemoglobin levels in may were ranging from 8.7-9.5 g/dl) and platelets were 562301. PT was 12.2, INR 0.92 and PTT was 20. Her CMP shows a sodium of 137, potassium 5.0, chloride 101 and bicarbonate 21 mmol/L. BUN was 34, creatinine 1.33 and glucose 122 mg/dL. Her LFTs were within normal limits. Lipase was 45.  Hospital Course:   Rectal bleeding -She has had no further bleeding since admission. -Discussed with GI, no plans for further intervention, given no bleeding and stable hemoglobin okay for discharge home today.  Acute blood loss anemia -Hemoglobin is above 8, no plans for transfusion at present.  Type 2 diabetes -Fair control, continue to monitor.  Hyperlipidemia -Continue simvastatin.  Depression with anxiety -Continue Lexapro, Ativan.    Procedures:  None  Consultations:  GI  Discharge Instructions  Discharge Instructions    Diet - low sodium heart healthy    Complete by:  As directed    Increase activity slowly    Complete by:  As directed      Allergies as of 11/27/2016      Reactions   Penicillins Rash   Has patient had a PCN reaction causing immediate rash, facial/tongue/throat swelling, SOB or lightheadedness with hypotension: No Has patient had a PCN reaction causing severe rash involving mucus membranes or skin necrosis: No Has patient had a PCN reaction that required hospitalization No Has patient had a PCN reaction occurring within the last 10 years: No If all of the above answers are "NO", then may  proceed with Cephalosporin use.      Medication List    STOP taking these medications   furosemide 20 MG tablet Commonly known as:  LASIX     TAKE these medications   escitalopram 10 MG tablet Commonly known as:  LEXAPRO Take 10 mg by mouth daily.   glipiZIDE-metformin 5-500 MG tablet Commonly  known as:  METAGLIP Take 1 tablet by mouth 2 (two) times daily before a meal.   lisinopril 20 MG tablet Commonly known as:  PRINIVIL,ZESTRIL Take 20 mg by mouth daily.   LORazepam 0.5 MG tablet Commonly known as:  ATIVAN Take 0.5 mg by mouth 2 (two) times daily.   MYRBETRIQ 25 MG Tb24 tablet Generic drug:  mirabegron ER Take 25 mg by mouth daily.   nystatin-triamcinolone cream Commonly known as:  MYCOLOG II Apply topically 2 (two) times daily. To rash in groin/folds What changed:  how much to take  when to take this  reasons to take this  additional instructions   pantoprazole 40 MG tablet Commonly known as:  PROTONIX Take 1 tablet (40 mg total) by mouth 2 (two) times daily before a meal.   simvastatin 40 MG tablet Commonly known as:  ZOCOR Take 40 mg by mouth at bedtime.      Allergies  Allergen Reactions  . Penicillins Rash    Has patient had a PCN reaction causing immediate rash, facial/tongue/throat swelling, SOB or lightheadedness with hypotension: No Has patient had a PCN reaction causing severe rash involving mucus membranes or skin necrosis: No Has patient had a PCN reaction that required hospitalization No Has patient had a PCN reaction occurring within the last 10 years: No If all of the above answers are "NO", then may proceed with Cephalosporin use.     Follow-up Information    Pearson Grippe, MD. Schedule an appointment as soon as possible for a visit in 2 week(s).   Specialty:  Internal Medicine Contact information: [address] [place] [address] [phone]            The results of significant diagnostics from this hospitalization (including imaging, microbiology, ancillary and laboratory) are listed below for reference.    Significant Diagnostic Studies: US Abdomen Limited Ruq  Result Date: 11/26/2016 CLINICAL DATA:  Right upper quadrant pain EXAM: ULTRASOUND ABDOMEN LIMITED RIGHT UPPER QUADRANT COMPARISON:  CT  abdomen and pelvis March 02, 2016 FINDINGS: Gallbladder: Surgically absent. Common bile duct: Diameter: 5 mm. No intrahepatic or extrahepatic biliary duct dilatation. Liver: No focal lesion identified. Within normal limits in parenchymal echogenicity. Portal vein is patent on color Doppler imaging with normal direction of blood flow towards the liver. IMPRESSION: Gallbladder absent.  Study otherwise unremarkable. Electronically Signed   By: Bretta Bang III M.D.   On: 11/26/2016 09:21    Microbiology: No results found for this or any previous visit (from the past 240 hour(s)).   Labs: Basic Metabolic Panel:  Recent Labs Lab 11/25/16 1641 11/26/16 0424 11/27/16 0459  NA 137 141 139  K 5.0 4.1 3.9  CL 101 107 105  CO2 25 26 27   GLUCOSE 122* 141* 133*  BUN 34* 25* 13  CREATININE 1.33* 1.15* 1.02*  CALCIUM 8.7* 8.3* 8.8*   Liver Function Tests:  Recent Labs Lab 11/25/16 1641 11/26/16 0424  AST 25 21  ALT 12* 13*  ALKPHOS 61 56  BILITOT 0.5 0.3  PROT 7.5 6.9  ALBUMIN 3.6 3.3*    Recent Labs Lab 11/25/16 1641  LIPASE  45   No results for input(s): AMMONIA in the last 168 hours. CBC:  Recent Labs Lab 11/25/16 1547 11/25/16 2241 11/26/16 0424 11/27/16 0459  WBC 8.0  --  6.1 5.7  NEUTROABS  --   --  2.8  --   HGB 9.7* 8.6* 8.4* 8.8*  HCT 31.5* 27.1* 26.6* 28.7*  MCV 82.9  --  82.1 83.4  PLT 301  --  278 249   Cardiac Enzymes: No results for input(s): CKTOTAL, CKMB, CKMBINDEX, TROPONINI in the last 168 hours. BNP: BNP (last 3 results) No results for input(s): BNP in the last 8760 hours.  ProBNP (last 3 results) No results for input(s): PROBNP in the last 8760 hours.  CBG:  Recent Labs Lab 11/26/16 1717 11/26/16 2101 11/26/16 2213 11/27/16 0729 11/27/16 1118  GLUCAP 161* 137* 121* 153* 272*       Signed:  HERNANDEZ ACOSTA,Donn Zanetti  [facility] Pager: [phone] 11/27/2016, 1:17 PM

## 2016-11-27 NOTE — Plan of Care (Signed)
Problem: Education: Goal: Knowledge of [facility] Education information/materials will improve Outcome: Progressing Discussed and reviewed plan of care with patient   

## 2016-11-27 NOTE — Plan of Care (Signed)
Problem: Fluid Volume: Goal: Ability to maintain a balanced intake and output will improve Outcome: Progressing Discussed and reviewed intake/ouput status(pt currently w/ IV therapy and external catheter)-monitoring accordingly.

## 2016-11-27 NOTE — Progress Notes (Signed)
Discussed discharge instructions with patient. Given copy of AVS. Reviewed medication list with her, states she has appointment with PCP already scheduled. Verbalized understanding of instructions. IV site d/c'd by nurse tech, site within normal limits. Pt left floor in stable condition via w/c accompanied by nurse tech. Earnstine RegalAshley Otisha Spickler, RN

## 2017-04-13 ENCOUNTER — Encounter (HOSPITAL_COMMUNITY)
Admission: RE | Admit: 2017-04-13 | Discharge: 2017-04-13 | Disposition: A | Discharge status: Home / Self Care | Payer: Medicare Other | Source: Ambulatory Visit | Attending: Family Medicine | Admitting: Family Medicine

## 2017-04-13 DIAGNOSIS — K2970 Gastritis, unspecified, without bleeding: Secondary | ICD-10-CM | POA: Insufficient documentation

## 2017-04-13 DIAGNOSIS — I10 Essential (primary) hypertension: Secondary | ICD-10-CM | POA: Diagnosis not present

## 2017-04-13 DIAGNOSIS — D649 Anemia, unspecified: Secondary | ICD-10-CM | POA: Diagnosis not present

## 2017-04-13 DIAGNOSIS — K449 Diaphragmatic hernia without obstruction or gangrene: Secondary | ICD-10-CM | POA: Insufficient documentation

## 2017-04-13 DIAGNOSIS — E119 Type 2 diabetes mellitus without complications: Secondary | ICD-10-CM | POA: Insufficient documentation

## 2017-04-13 DIAGNOSIS — K5792 Diverticulitis of intestine, part unspecified, without perforation or abscess without bleeding: Secondary | ICD-10-CM | POA: Insufficient documentation

## 2017-04-13 LAB — PREPARE RBC (CROSSMATCH)

## 2017-04-13 LAB — HEMOGLOBIN AND HEMATOCRIT, BLOOD
HEMATOCRIT: 22.2 % — AB (ref 36.0–46.0)
Hemoglobin: 6 g/dL — CL (ref 12.0–15.0)

## 2017-04-14 ENCOUNTER — Encounter (HOSPITAL_COMMUNITY): Payer: Self-pay

## 2017-04-14 ENCOUNTER — Encounter (HOSPITAL_COMMUNITY)
Admission: RE | Admit: 2017-04-14 | Discharge: 2017-04-14 | Disposition: A | Discharge status: Home / Self Care | Payer: Medicare Other | Source: Ambulatory Visit | Attending: Family Medicine | Admitting: Family Medicine

## 2017-04-14 DIAGNOSIS — D649 Anemia, unspecified: Secondary | ICD-10-CM | POA: Diagnosis not present

## 2017-04-14 MED ORDER — SODIUM CHLORIDE 0.9 % IV SOLN: Freq: Once | INTRAVENOUS | Status: DC

## 2017-04-14 NOTE — Progress Notes (Signed)
Patient tolerated infusions without s/s of reaction.  No distress or complaints noted.

## 2017-04-15 LAB — BPAM RBC
Blood Product Expiration Date: 201904082359
Blood Product Expiration Date: 201904092359
ISSUE DATE / TIME: 201903191045
ISSUE DATE / TIME: 201903190854
Unit Type and Rh: 6200
Unit Type and Rh: 6200

## 2017-04-15 LAB — TYPE AND SCREEN
ABO/RH(D): A POS
Antibody Screen: NEGATIVE
UNIT DIVISION: 0
Unit division: 0

## 2017-04-28 ENCOUNTER — Encounter (INDEPENDENT_AMBULATORY_CARE_PROVIDER_SITE_OTHER): Payer: Self-pay | Admitting: Internal Medicine

## 2017-04-28 ENCOUNTER — Ambulatory Visit (INDEPENDENT_AMBULATORY_CARE_PROVIDER_SITE_OTHER): Payer: Medicare Other | Admitting: Internal Medicine

## 2017-04-28 VITALS — BP 122/80 | HR 84 | Temp 97.7°F | Ht 60.0 in | Wt 179.2 lb

## 2017-04-28 DIAGNOSIS — K5791 Diverticulosis of intestine, part unspecified, without perforation or abscess with bleeding: Secondary | ICD-10-CM | POA: Diagnosis not present

## 2017-04-28 MED ORDER — PANTOPRAZOLE SODIUM 40 MG PO TBEC: 40.0000 mg | DELAYED_RELEASE_TABLET | Freq: Two times a day (BID) | ORAL | 22 refills | Status: DC

## 2017-04-28 NOTE — Patient Instructions (Signed)
OV in 6 months. 

## 2017-04-28 NOTE — Progress Notes (Signed)
Subjective:    Patient ID: Nicole SmilesBrenda S [name], female    DOB: 1940/08/02, 77 y.o.   MRN: [id number]  HPI Here today for f/u. Hx of rectal bleeding and anemia. She underwent and EGD in February of 2018 and EGD and colonoscopy in May of 2018.  EGD in February of 2018 revealed large sliding hiatal hernia and mild changes of reflux esophagitis with antral scar and gastric erosions Biopsy was negative for H. Pylori.  Colonoscopy in May of 2018 revealed sigmoid colon diverticulosis and external hemorrhoids. EGD revealed hiatal hernia and gastric ulcer. H. Pylori was negative. Rectal bleeding felt to be secondary to colonic diverticulosis.  Received 2 units of PRBCs x 2 in March of 2018 She tells me she is doing good.She is eating well.  She has a room mate living with her. She has a BM about every 3-4 days. She has not seen any blood in her stool in the past week. She occasionally has some mid abdominal pain. Has been off the Protonix for about 2 months.  Sometimes her stools are brown and sometimes they are black. No NSAIDs.     CBC    Component Value Date/Time   WBC 5.7 11/27/2016 0459   RBC 3.44 (L) 11/27/2016 0459   HGB 6.0 (LL) 04/13/2017 1313   HCT 22.2 (L) 04/13/2017 1313   PLT 249 11/27/2016 0459   MCV 83.4 11/27/2016 0459   MCH 25.6 (L) 11/27/2016 0459   MCHC 30.7 11/27/2016 0459   RDW 13.6 11/27/2016 0459   LYMPHSABS 2.2 11/26/2016 0424   MONOABS 0.6 11/26/2016 0424   EOSABS 0.4 11/26/2016 0424   BASOSABS 0.0 11/26/2016 0424      Review of Systems  Past Medical History:  Diagnosis Date  . Depression with anxiety   . Diabetes mellitus without complication (HCC)   . Hypercholesterolemia   . Hypertension     Past Surgical History:  Procedure Laterality Date  . ABDOMINAL HYSTERECTOMY    . COLONOSCOPY WITH PROPOFOL N/A 06/20/2016   Procedure: COLONOSCOPY WITH PROPOFOL;  Surgeon: Malissa Hippoehman, Najeeb U, MD;  Location: [facility];  Service: Endoscopy;  Laterality: N/A;  .  ESOPHAGOGASTRODUODENOSCOPY N/A 03/03/2016   Procedure: ESOPHAGOGASTRODUODENOSCOPY (EGD);  Surgeon: Malissa HippoNajeeb U Rehman, MD;  Location: [facility];  Service: Endoscopy;  Laterality: N/A;  . ESOPHAGOGASTRODUODENOSCOPY (EGD) WITH PROPOFOL N/A 06/20/2016   Procedure: ESOPHAGOGASTRODUODENOSCOPY (EGD) WITH PROPOFOL;  Surgeon: Malissa Hippoehman, Najeeb U, MD;  Location: [facility];  Service: Endoscopy;  Laterality: N/A;  . FRACTURE SURGERY      Allergies  Allergen Reactions  . Penicillins Rash    Has patient had a PCN reaction causing immediate rash, facial/tongue/throat swelling, SOB or lightheadedness with hypotension: No Has patient had a PCN reaction causing severe rash involving mucus membranes or skin necrosis: No Has patient had a PCN reaction that required hospitalization No Has patient had a PCN reaction occurring within the last 10 years: No If all of the above answers are "NO", then may proceed with Cephalosporin use.      Current Outpatient Medications on File Prior to Visit  Medication Sig Dispense Refill  . escitalopram (LEXAPRO) 10 MG tablet Take 10 mg by mouth daily.    Marland Kitchen. glipiZIDE-metformin (METAGLIP) 5-500 MG per tablet Take 1 tablet by mouth 2 (two) times daily before a meal.    . lisinopril (PRINIVIL,ZESTRIL) 20 MG tablet Take 20 mg by mouth daily.     Marland Kitchen. LORazepam (ATIVAN) 0.5 MG tablet Take 0.5 mg  by mouth 2 (two) times daily.     . mirabegron ER (MYRBETRIQ) 25 MG TB24 tablet Take 25 mg by mouth daily.    Marland Kitchen nystatin-triamcinolone (MYCOLOG II) cream Apply topically 2 (two) times daily. To rash in groin/folds (Patient taking differently: Apply 1 application topically daily as needed. To rash in groin/folds) 30 g 0  . simvastatin (ZOCOR) 40 MG tablet Take 40 mg by mouth at bedtime.      No current facility-administered medications on file prior to visit.             Objective:   Physical Exam Blood pressure 122/80, pulse 84, temperature 97.7 F (36.5 C), height 5' (1.524 m),  weight 179 lb 3.2 oz (81.3 kg). Alert and oriented. Skin warm and dry. Oral mucosa is moist.   . Sclera anicteric, conjunctivae is pink. Thyroid not enlarged. No cervical lymphadenopathy. Lungs clear. Heart regular rate and rhythm.  Abdomen is soft. Bowel sounds are positive. No hepatomegaly. No abdominal masses felt. No tenderness.  No edema to lower extremities.           Assessment & Plan:  Lower GI bleeding. Will get labs from Dr Selena Batten. Will restart the Protonix BID. OV in 6 months.  May need repeat EGD

## 2017-04-30 ENCOUNTER — Telehealth (INDEPENDENT_AMBULATORY_CARE_PROVIDER_SITE_OTHER): Payer: Self-pay | Admitting: Internal Medicine

## 2017-04-30 NOTE — Telephone Encounter (Signed)
Could u call Dr. Elmyra RicksKim's office again and get latest lab work

## 2017-05-01 NOTE — Telephone Encounter (Signed)
err

## 2017-05-05 ENCOUNTER — Encounter (INDEPENDENT_AMBULATORY_CARE_PROVIDER_SITE_OTHER): Payer: Self-pay | Admitting: *Deleted

## 2017-05-05 ENCOUNTER — Telehealth (INDEPENDENT_AMBULATORY_CARE_PROVIDER_SITE_OTHER): Payer: Self-pay | Admitting: Internal Medicine

## 2017-05-05 ENCOUNTER — Other Ambulatory Visit (INDEPENDENT_AMBULATORY_CARE_PROVIDER_SITE_OTHER): Payer: Self-pay | Admitting: *Deleted

## 2017-05-05 DIAGNOSIS — K922 Gastrointestinal hemorrhage, unspecified: Secondary | ICD-10-CM

## 2017-05-05 NOTE — Telephone Encounter (Signed)
[name], H and H in 2 weeks 

## 2017-05-05 NOTE — Telephone Encounter (Signed)
Will get a H and H in 2 weeks.

## 2017-05-05 NOTE — Telephone Encounter (Signed)
H&H noted and a letter has been sent as a reminder.

## 2017-05-05 NOTE — Telephone Encounter (Signed)
Patients caregiver [facility]([place] called stated patient would not understand and she had asked someone to call her with results from visit last week - would like a call back ph# [phone]

## 2017-11-02 ENCOUNTER — Encounter (INDEPENDENT_AMBULATORY_CARE_PROVIDER_SITE_OTHER): Payer: Self-pay | Admitting: Internal Medicine

## 2017-11-02 ENCOUNTER — Ambulatory Visit (INDEPENDENT_AMBULATORY_CARE_PROVIDER_SITE_OTHER): Payer: Medicare Other | Admitting: Internal Medicine

## 2017-11-12 ENCOUNTER — Other Ambulatory Visit (INDEPENDENT_AMBULATORY_CARE_PROVIDER_SITE_OTHER): Payer: Self-pay | Admitting: Internal Medicine

## 2018-04-02 IMAGING — CT CT ABD-PELV W/O CM
2 of 4 series · 15 of 46 positions shown, 17 images · non-contrast
Comparison: 06/21/2012 CT abdomen/pelvis.

CLINICAL DATA: Reported history is abdominal mass. Patient reports
history of ventral hernia, recent falls and diarrhea. Prior
hysterectomy.

EXAM:
CT ABDOMEN AND PELVIS WITHOUT CONTRAST
TECHNIQUE: Multidetector CT imaging of the abdomen and pelvis was performed
following the standard protocol without IV contrast.

[Series 2: axial st · axial · 0.83mm/px · z∈[+682,+1102]mm · 12 of 92 slices shown, 14 images]
[im 4/92  soft-tissue]
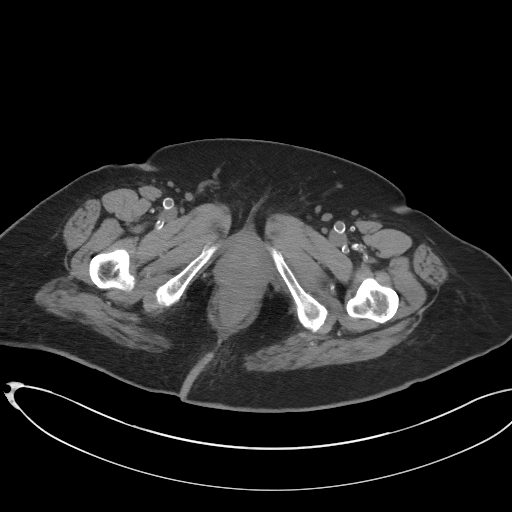
[im 4/92  bone]
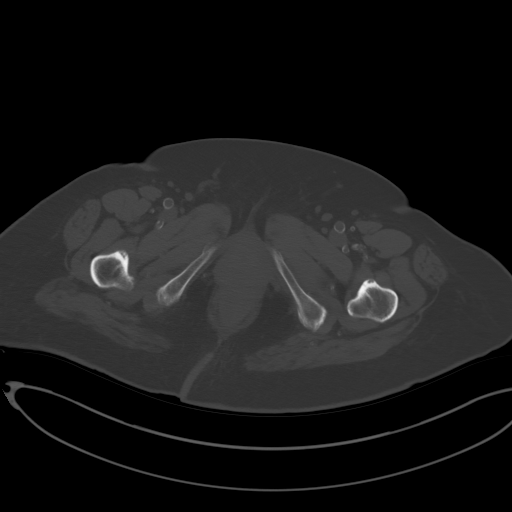
[im 12/92  soft-tissue]
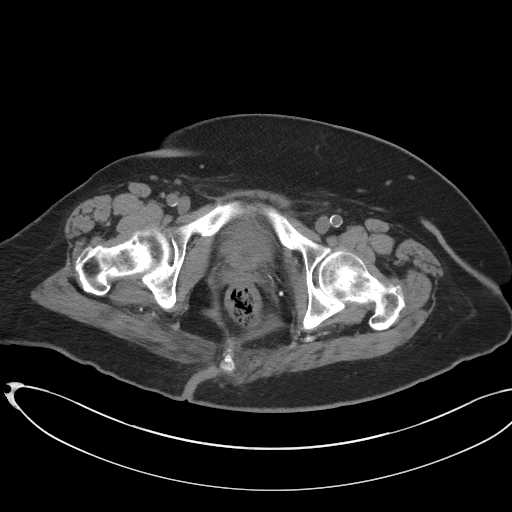
[im 20/92  soft-tissue]
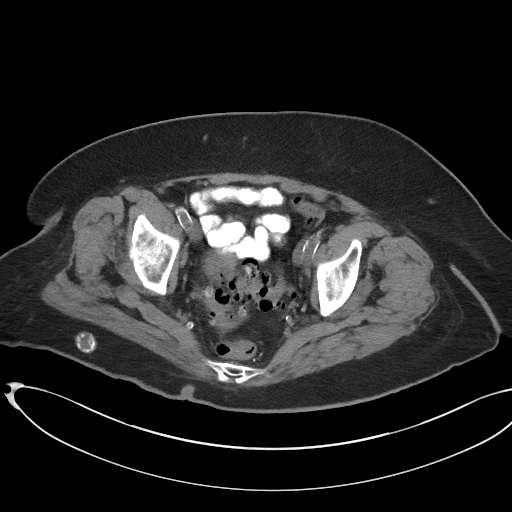
[im 28/92  soft-tissue]
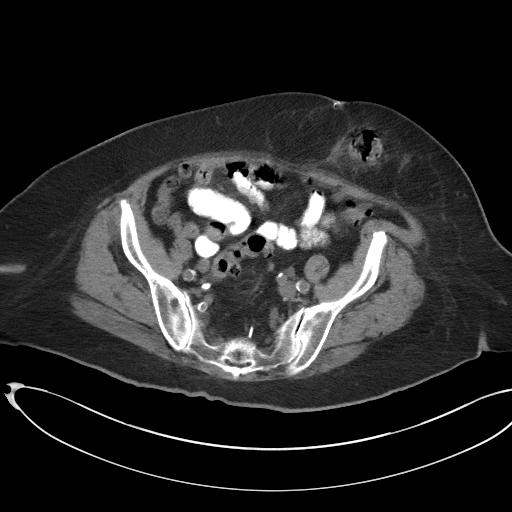
[im 36/92  soft-tissue]
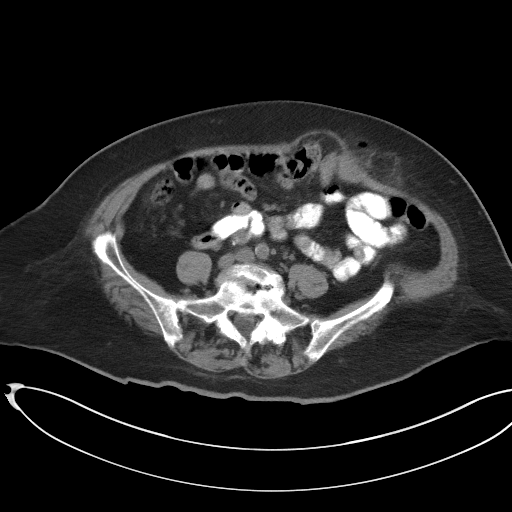
[im 44/92  soft-tissue]
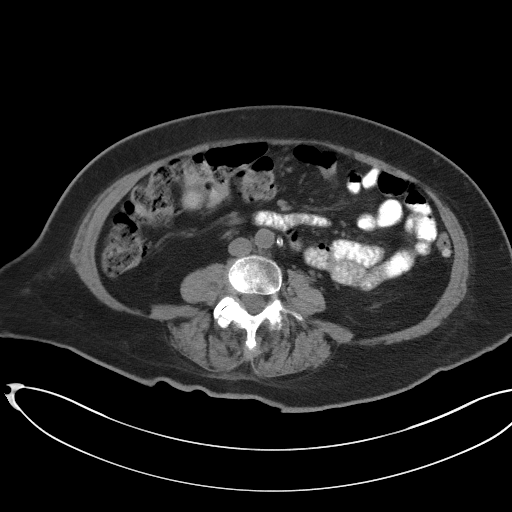
[im 48/92  soft-tissue]
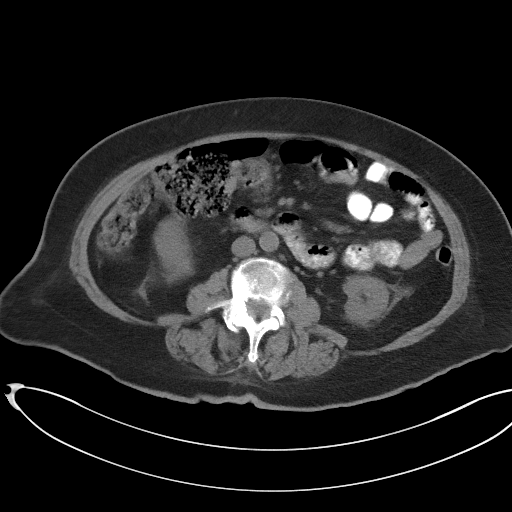
[im 56/92  soft-tissue]
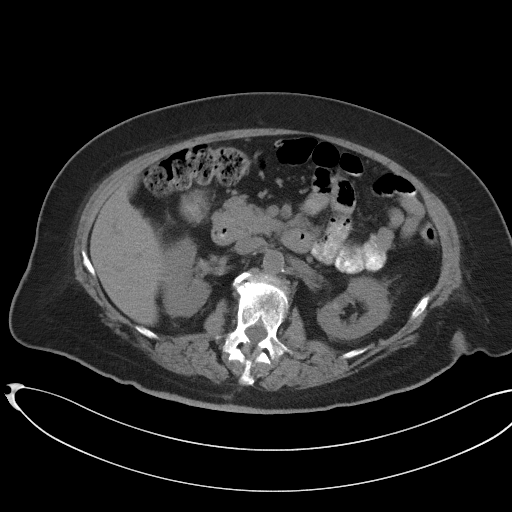
[im 64/92  soft-tissue]
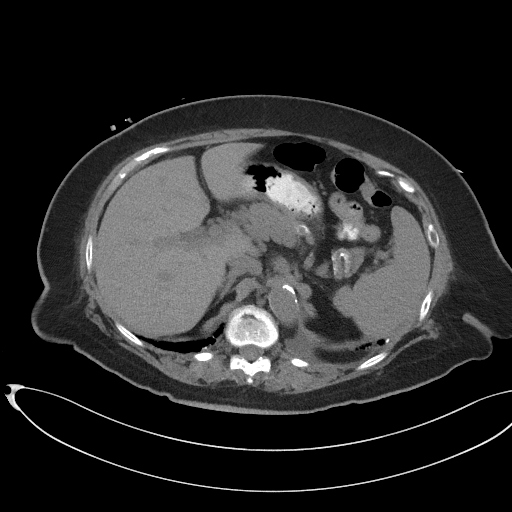
[im 64/92  bone]
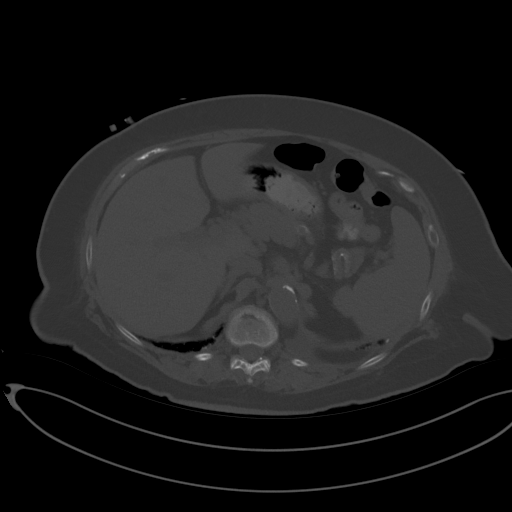
[im 72/92  soft-tissue]
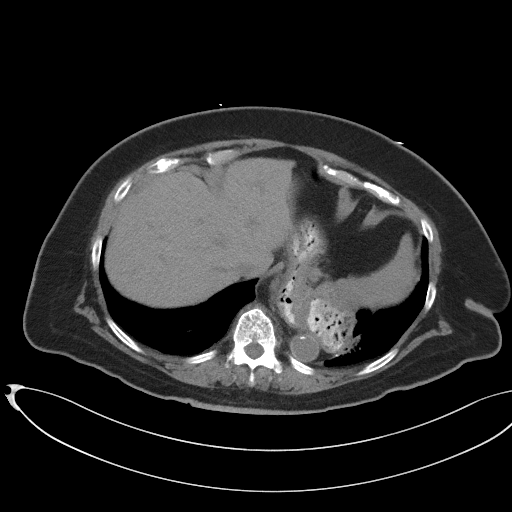
[im 80/92  soft-tissue]
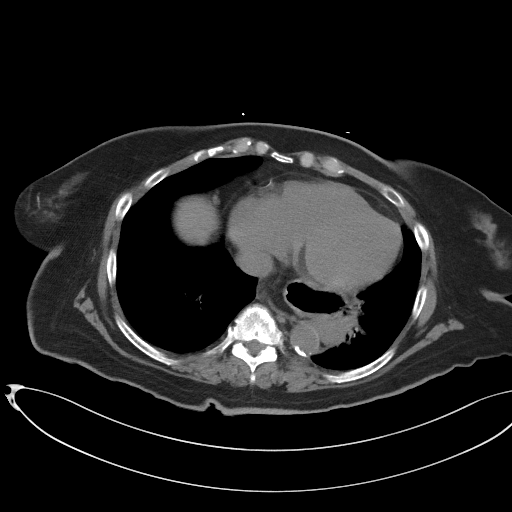
[im 88/92  soft-tissue]
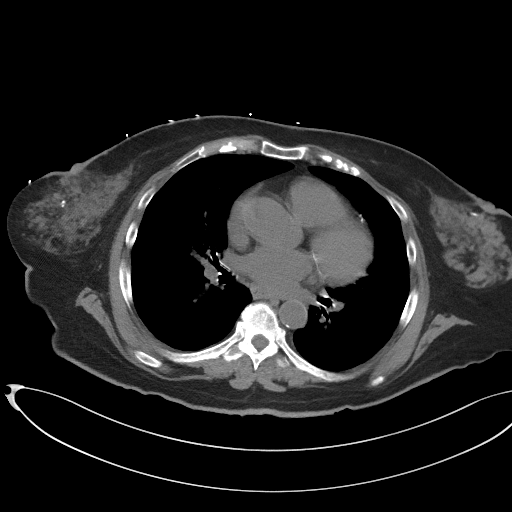

[Series 5: coronal st · coronal · 0.80mm/px · 3 of 100 slices shown]
[im 34/100  soft-tissue]
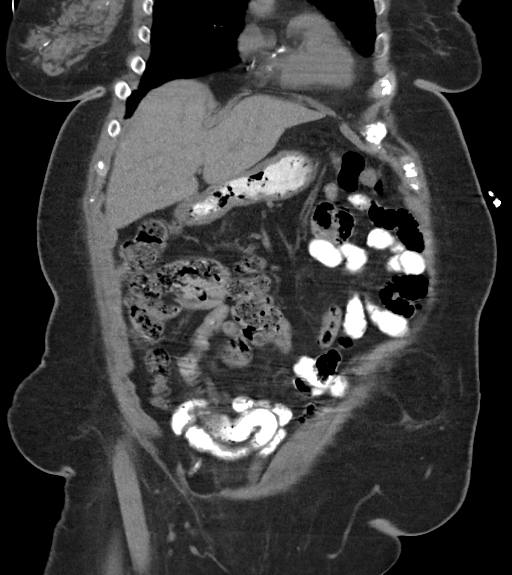
[im 45/100  soft-tissue]
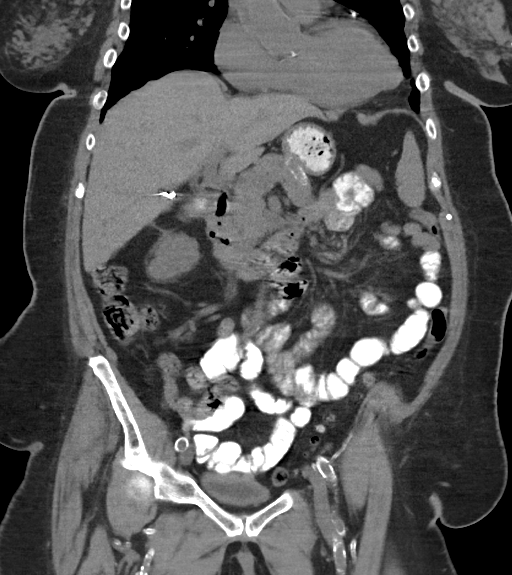
[im 56/100  soft-tissue]
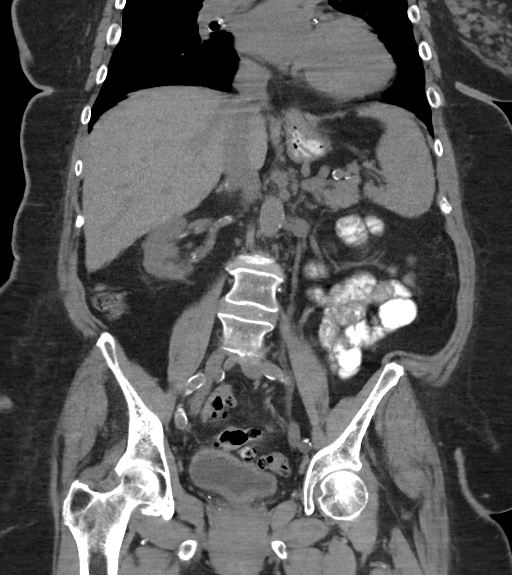

[15 of 46 positions shown; findings below may reference images not displayed]

FINDINGS: Partially motion degraded scan.

Lower chest: Subpleural 5 mm perifissural anterior left lower lobe
solid pulmonary nodule (series 4/ image 5). Mild compressive
atelectasis in the medial basilar left lower lobe. Left anterior
descending, left circumflex and right coronary atherosclerosis.

Hepatobiliary: Normal liver with no liver mass. Cholecystectomy . No
biliary ductal dilatation.

Pancreas: Normal, with no mass or duct dilation.

Spleen: Normal size. No mass.

Adrenals/Urinary Tract: Stable calcifications in the left greater
than right adrenal glands without discrete adrenal nodules. No
hydronephrosis. No renal stones. Calcifications in the renal sinus
bilaterally are favored to represent vascular calcifications. No
contour deforming renal masses . Relatively collapsed and grossly
normal bladder.

Stomach/Bowel: Moderate hiatal hernia. Otherwise grossly normal
nondistended stomach. Normal caliber small bowel with no small bowel
wall thickening. Appendix is not discretely visualized. No pericecal
inflammatory changes. There is a large umbilical hernia containing
fat and a portion of the mid transverse colon, overall stable in
size since 06/21/2012. Marked sigmoid diverticulosis. No large bowel
wall thickening, pericolonic fat stranding or pneumatosis. Moderate
stool throughout the colon. No significant colonic distention.

Vascular/Lymphatic: Atherosclerotic nonaneurysmal abdominal aorta.
No pathologically enlarged lymph nodes in the abdomen or pelvis.

Reproductive: Status post hysterectomy, with no abnormal findings at
the vaginal cuff. No adnexal mass.

Other: No pneumoperitoneum, ascites or focal fluid collection.

Musculoskeletal: No aggressive appearing focal osseous lesions.
Marked thoracolumbar spondylosis.
IMPRESSION: 1. Large periumbilical ventral hernia, overall stable in size since
7077, although now containing a portion of the mid transverse colon.
No CT evidence of bowel obstruction, ischemia or perforation.
2. Perifissural 5 mm left lower lobe pulmonary nodule, probably
benign. No follow-up needed if patient is low-risk. Non-contrast
chest CT can be considered in 12 months if patient is high-risk.
This recommendation follows the consensus statement: Guidelines for
Management of Incidental Pulmonary Nodules Detected on CT Images:
3. Additional findings include moderate hiatal hernia, aortic
atherosclerosis, three-vessel coronary atherosclerosis and marked
sigmoid diverticulosis.

## 2018-05-19 ENCOUNTER — Other Ambulatory Visit (INDEPENDENT_AMBULATORY_CARE_PROVIDER_SITE_OTHER): Payer: Self-pay | Admitting: Internal Medicine

## 2018-07-28 DEATH — deceased
